# Patient Record
Sex: Male | Born: 1965 | Race: Black or African American | Hispanic: No | Marital: Single | Smoking: Never smoker
Health system: Southern US, Community
[De-identification: ages and names within clinical notes are randomized; demographics above are authoritative.]

## PROBLEM LIST (undated history)

## (undated) DIAGNOSIS — E785 Hyperlipidemia, unspecified: Secondary | ICD-10-CM

## (undated) DIAGNOSIS — I10 Essential (primary) hypertension: Secondary | ICD-10-CM

## (undated) DIAGNOSIS — E119 Type 2 diabetes mellitus without complications: Secondary | ICD-10-CM

## (undated) DIAGNOSIS — A549 Gonococcal infection, unspecified: Secondary | ICD-10-CM

## (undated) HISTORY — PX: NO PAST SURGERIES: SHX2092

---

## 2002-12-25 ENCOUNTER — Emergency Department (HOSPITAL_COMMUNITY): Admission: EM | Admit: 2002-12-25 | Discharge: 2002-12-25 | Payer: Self-pay | Admitting: Emergency Medicine

## 2002-12-25 ENCOUNTER — Encounter: Payer: Self-pay | Admitting: Emergency Medicine

## 2009-10-21 ENCOUNTER — Emergency Department (HOSPITAL_COMMUNITY): Admission: EM | Admit: 2009-10-21 | Discharge: 2009-10-21 | Payer: Self-pay | Admitting: Emergency Medicine

## 2010-10-06 LAB — URINE MICROSCOPIC-ADD ON

## 2010-10-06 LAB — URINALYSIS, ROUTINE W REFLEX MICROSCOPIC
Bilirubin Urine: NEGATIVE
Glucose, UA: NEGATIVE mg/dL
Hgb urine dipstick: NEGATIVE
Ketones, ur: NEGATIVE mg/dL
Nitrite: NEGATIVE
Protein, ur: NEGATIVE mg/dL
Specific Gravity, Urine: 1.019 (ref 1.005–1.030)
Urobilinogen, UA: 1 mg/dL (ref 0.0–1.0)
pH: 6 (ref 5.0–8.0)

## 2010-10-06 LAB — GC/CHLAMYDIA PROBE AMP, GENITAL
Chlamydia, DNA Probe: NEGATIVE
GC Probe Amp, Genital: POSITIVE — AB

## 2012-08-09 ENCOUNTER — Inpatient Hospital Stay (HOSPITAL_COMMUNITY)
Admission: EM | Admit: 2012-08-09 | Discharge: 2012-08-12 | DRG: 638 | Disposition: A | Discharge status: Home / Self Care | Payer: MEDICAID | Attending: Internal Medicine | Admitting: Internal Medicine

## 2012-08-09 ENCOUNTER — Encounter (HOSPITAL_COMMUNITY): Payer: Self-pay | Admitting: *Deleted

## 2012-08-09 DIAGNOSIS — F1210 Cannabis abuse, uncomplicated: Secondary | ICD-10-CM | POA: Diagnosis present

## 2012-08-09 DIAGNOSIS — E876 Hypokalemia: Secondary | ICD-10-CM | POA: Diagnosis not present

## 2012-08-09 DIAGNOSIS — E1100 Type 2 diabetes mellitus with hyperosmolarity without nonketotic hyperglycemic-hyperosmolar coma (NKHHC): Principal | ICD-10-CM | POA: Diagnosis present

## 2012-08-09 DIAGNOSIS — E875 Hyperkalemia: Secondary | ICD-10-CM | POA: Diagnosis present

## 2012-08-09 DIAGNOSIS — N179 Acute kidney failure, unspecified: Secondary | ICD-10-CM | POA: Diagnosis present

## 2012-08-09 DIAGNOSIS — R739 Hyperglycemia, unspecified: Secondary | ICD-10-CM

## 2012-08-09 DIAGNOSIS — E785 Hyperlipidemia, unspecified: Secondary | ICD-10-CM | POA: Diagnosis present

## 2012-08-09 DIAGNOSIS — Z6841 Body Mass Index (BMI) 40.0 and over, adult: Secondary | ICD-10-CM

## 2012-08-09 DIAGNOSIS — E669 Obesity, unspecified: Secondary | ICD-10-CM | POA: Diagnosis present

## 2012-08-09 DIAGNOSIS — I10 Essential (primary) hypertension: Secondary | ICD-10-CM | POA: Diagnosis present

## 2012-08-09 DIAGNOSIS — E871 Hypo-osmolality and hyponatremia: Secondary | ICD-10-CM | POA: Diagnosis present

## 2012-08-09 HISTORY — DX: Hyperlipidemia, unspecified: E78.5

## 2012-08-09 HISTORY — DX: Essential (primary) hypertension: I10

## 2012-08-09 HISTORY — DX: Gonococcal infection, unspecified: A54.9

## 2012-08-09 LAB — URINE MICROSCOPIC-ADD ON

## 2012-08-09 LAB — URINALYSIS, ROUTINE W REFLEX MICROSCOPIC
Bilirubin Urine: NEGATIVE
Glucose, UA: >1000 mg/dL — AB
Hgb urine dipstick: NEGATIVE
Ketones, ur: 15 mg/dL — AB
Leukocytes, UA: NEGATIVE
Nitrite: NEGATIVE
Protein, ur: NEGATIVE mg/dL
Specific Gravity, Urine: 1.033 — ABNORMAL HIGH (ref 1.005–1.030)
Urobilinogen, UA: 0.2 mg/dL (ref 0.0–1.0)
pH: 5 (ref 5.0–8.0)

## 2012-08-09 LAB — POCT I-STAT 3, ART BLOOD GAS (G3+)
Acid-base deficit: 5 mmol/L — ABNORMAL HIGH (ref 0.0–2.0)
Bicarbonate: 19.4 mEq/L — ABNORMAL LOW (ref 20.0–24.0)
O2 Saturation: 93 %
Patient temperature: 98.6
TCO2: 20 mmol/L (ref 0–100)
pCO2 arterial: 33.5 mmHg — ABNORMAL LOW (ref 35.0–45.0)
pH, Arterial: 7.371 (ref 7.350–7.450)
pO2, Arterial: 68 mmHg — ABNORMAL LOW (ref 80.0–100.0)

## 2012-08-09 LAB — COMPREHENSIVE METABOLIC PANEL
ALT: 26 U/L (ref 0–53)
AST: 18 U/L (ref 0–37)
Albumin: 4.4 g/dL (ref 3.5–5.2)
Alkaline Phosphatase: 67 U/L (ref 39–117)
BUN: 24 mg/dL — ABNORMAL HIGH (ref 6–23)
CO2: 19 mEq/L (ref 19–32)
Calcium: 9.9 mg/dL (ref 8.4–10.5)
Chloride: 80 mEq/L — ABNORMAL LOW (ref 96–112)
Creatinine, Ser: 1.44 mg/dL — ABNORMAL HIGH (ref 0.50–1.35)
GFR calc Af Amer: 66 mL/min — ABNORMAL LOW (ref >90)
GFR calc non Af Amer: 57 mL/min — ABNORMAL LOW (ref >90)
Glucose, Bld: 856 mg/dL (ref 70–99)
Potassium: 5.4 mEq/L — ABNORMAL HIGH (ref 3.5–5.1)
Sodium: 121 mEq/L — ABNORMAL LOW (ref 135–145)
Total Bilirubin: 1.1 mg/dL (ref 0.3–1.2)
Total Protein: 8.8 g/dL — ABNORMAL HIGH (ref 6.0–8.3)

## 2012-08-09 LAB — BASIC METABOLIC PANEL
BUN: 18 mg/dL (ref 6–23)
CO2: 26 mEq/L (ref 19–32)
Calcium: 9.1 mg/dL (ref 8.4–10.5)
Chloride: 93 mEq/L — ABNORMAL LOW (ref 96–112)
Creatinine, Ser: 1.28 mg/dL (ref 0.50–1.35)
GFR calc Af Amer: 76 mL/min — ABNORMAL LOW (ref >90)
GFR calc non Af Amer: 65 mL/min — ABNORMAL LOW (ref >90)
Glucose, Bld: 278 mg/dL — ABNORMAL HIGH (ref 70–99)
Potassium: 3.6 mEq/L (ref 3.5–5.1)
Sodium: 132 mEq/L — ABNORMAL LOW (ref 135–145)

## 2012-08-09 LAB — CBC WITH DIFFERENTIAL/PLATELET
Basophils Absolute: 0.1 10*3/uL (ref 0.0–0.1)
Basophils Relative: 1 % (ref 0–1)
Eosinophils Absolute: 0.1 10*3/uL (ref 0.0–0.7)
Eosinophils Relative: 1 % (ref 0–5)
HCT: 45.1 % (ref 39.0–52.0)
Hemoglobin: 16.4 g/dL (ref 13.0–17.0)
Lymphocytes Relative: 41 % (ref 12–46)
Lymphs Abs: 2.6 10*3/uL (ref 0.7–4.0)
MCH: 30.4 pg (ref 26.0–34.0)
MCHC: 36.4 g/dL — ABNORMAL HIGH (ref 30.0–36.0)
MCV: 83.7 fL (ref 78.0–100.0)
Monocytes Absolute: 0.5 10*3/uL (ref 0.1–1.0)
Monocytes Relative: 7 % (ref 3–12)
Neutro Abs: 3.2 10*3/uL (ref 1.7–7.7)
Neutrophils Relative %: 50 % (ref 43–77)
Platelets: 159 10*3/uL (ref 150–400)
RBC: 5.39 MIL/uL (ref 4.22–5.81)
RDW: 12 % (ref 11.5–15.5)
WBC: 6.4 10*3/uL (ref 4.0–10.5)

## 2012-08-09 LAB — GLUCOSE, CAPILLARY
Glucose-Capillary: >600 mg/dL (ref 70–99)
Glucose-Capillary: >600 mg/dL (ref 70–99)
Glucose-Capillary: 579 mg/dL (ref 70–99)
Glucose-Capillary: 482 mg/dL — ABNORMAL HIGH (ref 70–99)
Glucose-Capillary: 438 mg/dL — ABNORMAL HIGH (ref 70–99)
Glucose-Capillary: 392 mg/dL — ABNORMAL HIGH (ref 70–99)

## 2012-08-09 MED ORDER — INSULIN REGULAR BOLUS VIA INFUSION
0.0000 [IU] | Freq: Three times a day (TID) | INTRAVENOUS | Status: DC
  Administered 2012-08-09: 4 [IU] via INTRAVENOUS
  Filled 2012-08-09: qty 10

## 2012-08-09 MED ORDER — DEXTROSE-NACL 5-0.45 % IV SOLN
INTRAVENOUS | Status: DC
  Administered 2012-08-10 (×2): via INTRAVENOUS

## 2012-08-09 MED ORDER — INSULIN REGULAR BOLUS VIA INFUSION
0.0000 [IU] | Freq: Three times a day (TID) | INTRAVENOUS | Status: DC
  Filled 2012-08-09: qty 10

## 2012-08-09 MED ORDER — ONDANSETRON HCL 4 MG/2ML IJ SOLN: 4.0000 mg | Freq: Four times a day (QID) | INTRAMUSCULAR | Status: DC | PRN

## 2012-08-09 MED ORDER — INSULIN REGULAR HUMAN 100 UNIT/ML IJ SOLN
INTRAMUSCULAR | Status: DC
  Administered 2012-08-10: 02:00:00 via INTRAVENOUS
  Filled 2012-08-09 (×3): qty 1

## 2012-08-09 MED ORDER — ONDANSETRON HCL 4 MG PO TABS: 4.0000 mg | ORAL_TABLET | Freq: Four times a day (QID) | ORAL | Status: DC | PRN

## 2012-08-09 MED ORDER — ACETAMINOPHEN 325 MG PO TABS: 650.0000 mg | ORAL_TABLET | Freq: Four times a day (QID) | ORAL | Status: DC | PRN

## 2012-08-09 MED ORDER — SODIUM CHLORIDE 0.9 % IV BOLUS (SEPSIS)
1000.0000 mL | Freq: Once | INTRAVENOUS | Status: AC
  Administered 2012-08-09: 1000 mL via INTRAVENOUS

## 2012-08-09 MED ORDER — MENTHOL 3 MG MT LOZG
1.0000 | LOZENGE | OROMUCOSAL | Status: DC | PRN
  Filled 2012-08-09: qty 9

## 2012-08-09 MED ORDER — DEXTROSE 50 % IV SOLN: 25.0000 mL | INTRAVENOUS | Status: DC | PRN

## 2012-08-09 MED ORDER — ENOXAPARIN SODIUM 40 MG/0.4ML ~~LOC~~ SOLN
40.0000 mg | SUBCUTANEOUS | Status: DC
  Administered 2012-08-09 – 2012-08-11 (×3): 40 mg via SUBCUTANEOUS
  Filled 2012-08-09 (×4): qty 0.4

## 2012-08-09 MED ORDER — HYDRALAZINE HCL 20 MG/ML IJ SOLN
10.0000 mg | Freq: Four times a day (QID) | INTRAMUSCULAR | Status: DC | PRN
  Filled 2012-08-09: qty 0.5

## 2012-08-09 MED ORDER — ACETAMINOPHEN 650 MG RE SUPP: 650.0000 mg | Freq: Four times a day (QID) | RECTAL | Status: DC | PRN

## 2012-08-09 MED ORDER — ALBUTEROL SULFATE (5 MG/ML) 0.5% IN NEBU: 2.5000 mg | INHALATION_SOLUTION | RESPIRATORY_TRACT | Status: DC | PRN

## 2012-08-09 MED ORDER — SODIUM CHLORIDE 0.9 % IJ SOLN: 3.0000 mL | Freq: Two times a day (BID) | INTRAMUSCULAR | Status: DC

## 2012-08-09 MED ORDER — PANTOPRAZOLE SODIUM 40 MG PO TBEC
40.0000 mg | DELAYED_RELEASE_TABLET | Freq: Every day | ORAL | Status: DC
  Administered 2012-08-09 – 2012-08-10 (×2): 40 mg via ORAL
  Filled 2012-08-09 (×2): qty 1

## 2012-08-09 MED ORDER — SODIUM CHLORIDE 0.9 % IV SOLN
INTRAVENOUS | Status: DC
  Administered 2012-08-09 – 2012-08-10 (×3): via INTRAVENOUS

## 2012-08-09 MED ORDER — DEXTROSE-NACL 5-0.45 % IV SOLN
INTRAVENOUS | Status: DC
  Administered 2012-08-09: 19:00:00 via INTRAVENOUS

## 2012-08-09 MED ORDER — SODIUM CHLORIDE 0.9 % IV SOLN
INTRAVENOUS | Status: DC
  Administered 2012-08-09: 8 [IU]/h via INTRAVENOUS
  Filled 2012-08-09: qty 1

## 2012-08-09 MED ORDER — ALUM & MAG HYDROXIDE-SIMETH 200-200-20 MG/5ML PO SUSP: 30.0000 mL | Freq: Four times a day (QID) | ORAL | Status: DC | PRN

## 2012-08-09 MED ORDER — SODIUM CHLORIDE 0.9 % IV SOLN
INTRAVENOUS | Status: DC
  Administered 2012-08-09 (×2): via INTRAVENOUS

## 2012-08-09 MED ORDER — GI COCKTAIL ~~LOC~~
30.0000 mL | Freq: Once | ORAL | Status: AC
  Administered 2012-08-09: 30 mL via ORAL
  Filled 2012-08-09: qty 30

## 2012-08-09 NOTE — ED Provider Notes (Signed)
History     CSN: [id number]  Arrival date & time 08/09/12  1136   First MD Initiated Contact with Patient 08/09/12 1336      Chief Complaint  Patient presents with  . Abdominal Pain  . Urinary Frequency    (Consider location/radiation/quality/duration/timing/severity/associated sxs/prior treatment) HPI Comments: Patient with a history of hypertension presents with heartburn and penis pain x 1 week. He states he is having heartburn after he eats any food. He has tried taking Nexium for his symptoms but this has not helped. He is nauseated and has no appetite, but has not vomited. He also complains of penis pain. He is not circumcised and states there is a "coating over the head of my penis." He describes the coating as thick and brown. He has tried putting Vagisil on the head of his penis which seems to help with his symptoms. This is improving. He denies dysuria but reports urinary urgency and frequency. He also states his mouth is constantly dry no matter how much he drinks. He endorses polydipsia and polyuria. Mother and sister have history of DM. Onset gradual, course constant. Nothing has made symptoms better.   Patient is a 47 y.o. male presenting with abdominal pain and frequency. The history is provided by the patient.  Abdominal Pain The primary symptoms of the illness include abdominal pain (heartburn) and nausea. The primary symptoms of the illness do not include fever, fatigue, shortness of breath, vomiting, diarrhea or dysuria.  Additional symptoms associated with the illness include urgency and frequency. Symptoms associated with the illness do not include chills, constipation or hematuria.  Urinary Frequency Associated symptoms include abdominal pain (heartburn), nausea and a sore throat. Pertinent negatives include no chest pain, chills, congestion, coughing, fatigue, fever, headaches, myalgias, neck pain, rash, vomiting or weakness.    Past Medical History  Diagnosis Date    . Hypertension     History reviewed. No pertinent past surgical history.  History reviewed. No pertinent family history.  History  Substance Use Topics  . Smoking status: Never Smoker   . Smokeless tobacco: Not on file  . Alcohol Use: No      Review of Systems  Constitutional: Positive for appetite change. Negative for fever, chills and fatigue.  HENT: Positive for sore throat. Negative for ear pain, congestion, rhinorrhea, trouble swallowing and neck pain.   Eyes: Negative for redness.  Respiratory: Negative for cough, chest tightness and shortness of breath.   Cardiovascular: Negative for chest pain.  Gastrointestinal: Positive for nausea and abdominal pain (heartburn). Negative for vomiting, diarrhea, constipation and blood in stool.  Genitourinary: Positive for urgency, frequency and penile pain. Negative for dysuria, hematuria, discharge and genital sores.  Musculoskeletal: Negative for myalgias.  Skin: Negative for rash.  Neurological: Negative for weakness, light-headedness and headaches.    Allergies  Review of patient's allergies indicates no known allergies.  Home Medications   Current Outpatient Rx  Name  Route  Sig  Dispense  Refill  . HYDROCHLOROTHIAZIDE 25 MG PO TABS   Oral   Take 25 mg by mouth daily.         Ronney Asters COLD/FLU SEVERE PO   Oral   Take 2 capsules by mouth once.           BP 158/96  Pulse 117  Temp 98.8 F (37.1 C) (Oral)  SpO2 96%  Physical Exam  Nursing note and vitals reviewed. Constitutional: He is oriented to person, place, and time. He appears well-developed and well-nourished.  HENT:  Head: Normocephalic and atraumatic.  Eyes: Conjunctivae normal are normal. Pupils are equal, round, and reactive to light. Right eye exhibits no discharge. Left eye exhibits no discharge.  Neck: Normal range of motion. Neck supple.  Cardiovascular: Regular rhythm and normal heart sounds.  Tachycardia present.   Pulmonary/Chest: Effort  normal and breath sounds normal.  Abdominal: Soft. Bowel sounds are normal. He exhibits no mass. There is no tenderness.  Genitourinary: Right testis shows no mass and no tenderness. Left testis shows no mass and no tenderness. Uncircumcised. Penile erythema (head of penis) present. No phimosis, paraphimosis or penile tenderness. No discharge found.  Musculoskeletal: He exhibits no edema and no tenderness.  Lymphadenopathy:    He has no cervical adenopathy.  Neurological: He is alert and oriented to person, place, and time.  Skin: Skin is warm and dry.       Acanthosis nigricans bilateral axilla and around neck.   Psychiatric: He has a normal mood and affect.    ED Course  Procedures (including critical care time)  Labs Reviewed  URINALYSIS, ROUTINE W REFLEX MICROSCOPIC - Abnormal; Notable for the following:    Specific Gravity, Urine 1.033 (*)     Glucose, UA >1000 (*)     Ketones, ur 15 (*)     All other components within normal limits  CBC WITH DIFFERENTIAL - Abnormal; Notable for the following:    MCHC 36.4 (*)     All other components within normal limits  COMPREHENSIVE METABOLIC PANEL - Abnormal; Notable for the following:    Sodium 121 (*)     Potassium 5.4 (*)     Chloride 80 (*)     Glucose, Bld 856 (*)     BUN 24 (*)     Creatinine, Ser 1.44 (*)     Total Protein 8.8 (*)     GFR calc non Af Amer 57 (*)     GFR calc Af Amer 66 (*)     All other components within normal limits  GLUCOSE, CAPILLARY - Abnormal; Notable for the following:    Glucose-Capillary >600 (*)     All other components within normal limits  GLUCOSE, CAPILLARY - Abnormal; Notable for the following:    Glucose-Capillary >600 (*)     All other components within normal limits  URINE MICROSCOPIC-ADD ON  HEMOGLOBIN A1C   No results found.   1. Hyperglycemia     Patient seen and examined. Work-up initiated. Medications ordered, 3L fluids. Patient d/w and seen with Dr. Ignacia Palma. New diagnosis DM.     Vital signs reviewed and are as follows: Filed Vitals:   08/09/12 1514  BP: 140/85  Pulse: 102  Temp: 97.6 F (36.4 C)  Resp: 18   Will admit patient to stepdown. [facility] to see.   MDM  Admit hyperglycemia, new dx diabetes.     [place], [place] 08/09/12 [phone]

## 2012-08-09 NOTE — ED Notes (Signed)
CBG 392 

## 2012-08-09 NOTE — ED Notes (Addendum)
To ED for eval of weakness, decreased appetite, 'coating over the head of my penis' since last week. Pt denies flu like symptoms. Speech clear, resp e/u. Also c/o frequent urination at night and 'heartburn after I eat'. No CP 'ever only burning after I eat'.

## 2012-08-09 NOTE — ED Provider Notes (Signed)
Medical screening examination/treatment/procedure(s) were conducted as a shared visit with non-physician practitioner(s) and myself.  I personally evaluated the patient during the encounter 47 yo man with new symptoms of polyuria, found to have blood sugar >600, lab diluting his serum to get exact reading. Will need admission, glucose stabilizer,.        Carleene Cooper III, MD 08/09/12 2014

## 2012-08-09 NOTE — H&P (Signed)
[facility] History and Physical  Prithvi Kooi WGN:[id number] DOB: [birth year] DOA: 08/09/2012  Referring physician: Renne Crigler, ED PA PCP: Default, Provider, MD @ [facility] Specialists: None.  Chief Complaint: Urinary frequency.  HPI: Joshua Petty is a 47 y.o. male with history of HTN, HL presented to ED on 1/23 with 3 weeks history of progressively worsening polyuria (wakes up to 20 times at night), polydipsia, thirst, generalized weakness and blurred vision. He complains of burning of throat whenever he tries to eat or drink, associated nausea but no vomiting or abdominal pain, relieved to an extent by drinking cold liquids. Due to this, patient has only been drinking juices and eating fruits. No diarrhea. He gives history of head of penis being coated and irritative-apparently improved/resolved after using Vagisil. Recently had unprotected sex. HIV tested x2 (last one in December 2013) said to be negative. In ED, lab significant for sodium 121, potassium 5.4, creatinine 1.44, blood glucose 856. He is being bolused with 3 L of normal saline. The hospitalist service is requested to admit for further evaluation and management.   Review of Systems: All systems reviewed and apart from history of presenting illness, are negative  Past Medical History  Diagnosis Date  . Hypertension   . Hyperlipidemia   . Gonorrhea    Past Surgical History  Procedure Date  . No past surgeries    Social History:  reports that he has never smoked. He does not have any smokeless tobacco history on file. He reports that he uses illicit drugs (Marijuana). He reports that he does not drink alcohol. Single. Independent of activities of daily living.  No Known Allergies  Family History  Problem Relation Age of Onset  . Diabetes Father   . Diabetes Sister     Prior to Admission medications   Medication Sig Start Date End Date Taking? Authorizing Provider  hydrochlorothiazide (HYDRODIURIL)  25 MG tablet Take 25 mg by mouth daily.   Yes Historical Provider, MD  Phenylephrine-DM-GG-APAP (TYLENOL COLD/FLU SEVERE PO) Take 2 capsules by mouth once.   Yes Historical Provider, MD   Physical Exam: Filed Vitals:   08/09/12 1200 08/09/12 1514 08/09/12 1655  BP: 158/96 140/85 138/90  Pulse: 117 102   Temp: 98.8 F (37.1 C) 97.6 F (36.4 C)   TempSrc: Oral Oral   Resp:  18 18  SpO2: 96%       General exam: Moderately built and obese male patient lying comfortably supine in bed.  Head, eyes and ENT: Nontraumatic and normocephalic. Pupils equally reacting to light and accommodation. Bilateral immature cataracts. Oral mucosa dry. Bilateral enlarged but noninflamed tonsils. Unable to visualize posterior pharyngeal wall clearly.  Neck: Supple. No JVD, carotid bruit or thyromegaly.  Lymphatics: No lymphadenopathy.  Respiratory system: Clear to auscultation. No increased work of breathing.  Cardiovascular system: First and second heart sounds heard, RRR. No JVD, murmurs or pedal edema.  Gastrointestinal system: Abdomen is nondistended, soft and nontender. No organomegaly or masses appreciated. Normal bowel sounds heard.  External genitalia: No obvious acute abnormality seen.  Central nervous system: Alert and oriented. No focal neurological deficits.  Extremities: Symmetric 5 x 5 power. Peripheral pulses symmetrically felt.  Skin: Hyperpigmentation of skin around anterior neck (possibly related to DM)  Psychiatry: Pleasant and cooperative  Labs on Admission:  Basic Metabolic Panel:  Lab 08/09/12 7846  NA 121*  K 5.4*  CL 80*  CO2 19  GLUCOSE 856*  BUN 24*  CREATININE 1.44*  CALCIUM 9.9  MG --  PHOS --   Liver Function Tests:  Lab 08/09/12 1432  AST 18  ALT 26  ALKPHOS 67  BILITOT 1.1  PROT 8.8*  ALBUMIN 4.4   No results found for this basename: LIPASE:5,AMYLASE:5 in the last 168 hours No results found for this basename: AMMONIA:5 in the last 168  hours CBC:  Lab 08/09/12 1432  WBC 6.4  NEUTROABS 3.2  HGB 16.4  HCT 45.1  MCV 83.7  PLT 159   Cardiac Enzymes: No results found for this basename: CKTOTAL:5,CKMB:5,CKMBINDEX:5,TROPONINI:5 in the last 168 hours  BNP (last 3 results) No results found for this basename: PROBNP:3 in the last 8760 hours CBG:  Lab 08/09/12 1708 08/09/12 1614 08/09/12 1436  GLUCAP 579* >600* >600*    Radiological Exams on Admission: No results found.    Assessment/Plan Principal Problem:  *DM hyperosmolarity type II, uncontrolled Active Problems:  Dehydration with hyponatremia  Acute renal failure  Hyperkalemia  Marijuana abuse  Obesity  Newly diagnosed type 2 diabetes mellitus with hyperosmolar state Although anion gap: 22, small ketones in urine, patient's bicarbonate is almost normal. Admit to step down for close monitoring and management. Aggressive IV fluid hydration. Place on IV insulin drip. Monitor BMP closely. Check hemoglobin A1c. Diabetic diet. Consult diabetes coordinator. Once his blood sugar control has improved, transition to Lantus and sliding scale insulin. At discharge, he may have to go on 70/30 insulin for affordability  Dehydration Aggressive IV fluid hydration.  Hyponatremia Secondary to dehydration and hypoglycemia. Management as above and monitor BMP closely. Hold HCTZ.  Acute renal failure Baseline creatinine not available. Likely secondary to dehydration. Hydrate and follow BMP closely. Nonoliguric.  Hyperkalemia, mild Hydrate. Insulin drip. Followup BMP.  Hypertension Hold HCTZ. When necessary IV hydralazine. Consider ACE inhibitor when acute events have resolved.  Marijuana abuse Cessation counseled.  History of STD Patient's complaint of penile irritation was probably secondary to fungal infection that seems to have resolved. Given history of unprotected sex, we'll repeat HIV antibody. Counseled regarding protected sex.    Code Status: Full Family  Communication: Discussed with patient  Disposition Plan: Home when medically stable. Will need at least 2 midnights stay.  Time spent: 70 minutes  [facility] Pager [phone]  If 7PM-7AM, please contact night-coverage [email or web address] Password [facility] 08/09/2012, 5:15 PM

## 2012-08-09 NOTE — ED Notes (Signed)
CBG: 482 

## 2012-08-09 NOTE — Progress Notes (Signed)
47 yo man with new symptoms of polyuria, found to have blood sugar >600, lab diluting his serum to get exact reading.  Will need admission, glucose stabilizer,.

## 2012-08-10 DIAGNOSIS — E669 Obesity, unspecified: Secondary | ICD-10-CM

## 2012-08-10 LAB — CBC
HCT: 35.5 % — ABNORMAL LOW (ref 39.0–52.0)
Hemoglobin: 13.1 g/dL (ref 13.0–17.0)
MCH: 30.5 pg (ref 26.0–34.0)
MCHC: 36.9 g/dL — ABNORMAL HIGH (ref 30.0–36.0)
MCV: 82.8 fL (ref 78.0–100.0)
Platelets: 126 10*3/uL — ABNORMAL LOW (ref 150–400)
RBC: 4.29 MIL/uL (ref 4.22–5.81)
RDW: 12 % (ref 11.5–15.5)
WBC: 6.3 10*3/uL (ref 4.0–10.5)

## 2012-08-10 LAB — GLUCOSE, CAPILLARY
Glucose-Capillary: 180 mg/dL — ABNORMAL HIGH (ref 70–99)
Glucose-Capillary: 195 mg/dL — ABNORMAL HIGH (ref 70–99)
Glucose-Capillary: 210 mg/dL — ABNORMAL HIGH (ref 70–99)
Glucose-Capillary: 265 mg/dL — ABNORMAL HIGH (ref 70–99)
Glucose-Capillary: 284 mg/dL — ABNORMAL HIGH (ref 70–99)
Glucose-Capillary: 287 mg/dL — ABNORMAL HIGH (ref 70–99)
Glucose-Capillary: 293 mg/dL — ABNORMAL HIGH (ref 70–99)
Glucose-Capillary: 324 mg/dL — ABNORMAL HIGH (ref 70–99)
Glucose-Capillary: 327 mg/dL — ABNORMAL HIGH (ref 70–99)
Glucose-Capillary: 191 mg/dL — ABNORMAL HIGH (ref 70–99)
Glucose-Capillary: 183 mg/dL — ABNORMAL HIGH (ref 70–99)
Glucose-Capillary: 122 mg/dL — ABNORMAL HIGH (ref 70–99)
Glucose-Capillary: 141 mg/dL — ABNORMAL HIGH (ref 70–99)
Glucose-Capillary: 170 mg/dL — ABNORMAL HIGH (ref 70–99)
Glucose-Capillary: 177 mg/dL — ABNORMAL HIGH (ref 70–99)
Glucose-Capillary: 367 mg/dL — ABNORMAL HIGH (ref 70–99)
Glucose-Capillary: 373 mg/dL — ABNORMAL HIGH (ref 70–99)

## 2012-08-10 LAB — BASIC METABOLIC PANEL
BUN: 17 mg/dL (ref 6–23)
CO2: 25 mEq/L (ref 19–32)
Calcium: 8.8 mg/dL (ref 8.4–10.5)
Chloride: 97 mEq/L (ref 96–112)
Creatinine, Ser: 1.21 mg/dL (ref 0.50–1.35)
GFR calc Af Amer: 81 mL/min — ABNORMAL LOW (ref >90)
GFR calc non Af Amer: 70 mL/min — ABNORMAL LOW (ref >90)
Glucose, Bld: 238 mg/dL — ABNORMAL HIGH (ref 70–99)
Potassium: 2.9 mEq/L — ABNORMAL LOW (ref 3.5–5.1)
Sodium: 133 mEq/L — ABNORMAL LOW (ref 135–145)

## 2012-08-10 LAB — HIV ANTIBODY (ROUTINE TESTING W REFLEX): HIV: NONREACTIVE

## 2012-08-10 LAB — MRSA PCR SCREENING: MRSA by PCR: NEGATIVE

## 2012-08-10 LAB — HEMOGLOBIN A1C
Hgb A1c MFr Bld: 14.2 % — ABNORMAL HIGH (ref <5.7)
Mean Plasma Glucose: 361 mg/dL — ABNORMAL HIGH (ref <117)

## 2012-08-10 MED ORDER — SODIUM CHLORIDE 0.9 % IV SOLN
INTRAVENOUS | Status: AC
  Administered 2012-08-10: 14.8 [IU]/h via INTRAVENOUS
  Filled 2012-08-10: qty 1

## 2012-08-10 MED ORDER — METFORMIN HCL 500 MG PO TABS
500.0000 mg | ORAL_TABLET | Freq: Two times a day (BID) | ORAL | Status: DC
  Administered 2012-08-10 – 2012-08-11 (×2): 500 mg via ORAL
  Filled 2012-08-10 (×5): qty 1

## 2012-08-10 MED ORDER — HYDROCHLOROTHIAZIDE 25 MG PO TABS
25.0000 mg | ORAL_TABLET | Freq: Every day | ORAL | Status: DC
  Administered 2012-08-10 – 2012-08-12 (×3): 25 mg via ORAL
  Filled 2012-08-10 (×3): qty 1

## 2012-08-10 MED ORDER — INSULIN DETEMIR 100 UNIT/ML ~~LOC~~ SOLN
30.0000 [IU] | Freq: Every day | SUBCUTANEOUS | Status: DC
  Administered 2012-08-10: 30 [IU] via SUBCUTANEOUS
  Filled 2012-08-10: qty 10

## 2012-08-10 MED ORDER — POTASSIUM CHLORIDE CRYS ER 20 MEQ PO TBCR
40.0000 meq | EXTENDED_RELEASE_TABLET | ORAL | Status: AC
  Administered 2012-08-10 – 2012-08-11 (×3): 40 meq via ORAL
  Filled 2012-08-10 (×3): qty 2

## 2012-08-10 MED ORDER — GLIPIZIDE 10 MG PO TABS
10.0000 mg | ORAL_TABLET | Freq: Every day | ORAL | Status: DC
  Administered 2012-08-11: 10 mg via ORAL
  Filled 2012-08-10 (×3): qty 1

## 2012-08-10 MED ORDER — ASPIRIN 81 MG PO CHEW
CHEWABLE_TABLET | ORAL | Status: AC
  Administered 2012-08-10: 81 mg
  Filled 2012-08-10: qty 1

## 2012-08-10 MED ORDER — INSULIN ASPART 100 UNIT/ML ~~LOC~~ SOLN
4.0000 [IU] | Freq: Three times a day (TID) | SUBCUTANEOUS | Status: DC
  Administered 2012-08-10: 4 [IU] via SUBCUTANEOUS

## 2012-08-10 MED ORDER — INSULIN DETEMIR 100 UNIT/ML ~~LOC~~ SOLN
20.0000 [IU] | Freq: Every day | SUBCUTANEOUS | Status: DC
  Filled 2012-08-10: qty 10

## 2012-08-10 MED ORDER — INSULIN GLARGINE 100 UNIT/ML ~~LOC~~ SOLN: 20.0000 [IU] | Freq: Every day | SUBCUTANEOUS | Status: DC

## 2012-08-10 MED ORDER — INSULIN GLARGINE 100 UNIT/ML ~~LOC~~ SOLN
30.0000 [IU] | Freq: Every day | SUBCUTANEOUS | Status: DC
  Administered 2012-08-10: 30 [IU] via SUBCUTANEOUS

## 2012-08-10 MED ORDER — INSULIN DETEMIR 100 UNIT/ML ~~LOC~~ SOLN: 30.0000 [IU] | Freq: Every day | SUBCUTANEOUS | Status: DC

## 2012-08-10 MED ORDER — INSULIN ASPART 100 UNIT/ML ~~LOC~~ SOLN
0.0000 [IU] | Freq: Three times a day (TID) | SUBCUTANEOUS | Status: DC
  Administered 2012-08-10: 15 [IU] via SUBCUTANEOUS

## 2012-08-10 MED ORDER — ASPIRIN EC 81 MG PO TBEC
81.0000 mg | DELAYED_RELEASE_TABLET | Freq: Every day | ORAL | Status: DC
  Administered 2012-08-10 – 2012-08-12 (×3): 81 mg via ORAL
  Filled 2012-08-10 (×3): qty 1

## 2012-08-10 MED ORDER — LIVING WELL WITH DIABETES BOOK
Freq: Once | Status: AC
  Administered 2012-08-10: 11:00:00
  Filled 2012-08-10: qty 1

## 2012-08-10 MED ORDER — INSULIN ASPART 100 UNIT/ML ~~LOC~~ SOLN
0.0000 [IU] | Freq: Every day | SUBCUTANEOUS | Status: DC
  Administered 2012-08-10: 5 [IU] via SUBCUTANEOUS
  Administered 2012-08-11: 2 [IU] via SUBCUTANEOUS

## 2012-08-10 NOTE — Progress Notes (Signed)
[facility] Progress Note [place] TEAM 1 - Stepdown/ICU TEAM   Meredeth Ide GMW:[id number] DOB: August 30, 1965 DOA: 08/09/2012 PCP: Default, Provider, MD  Brief narrative: 47 y.o. male with history of HTN, HLD presented to ED on 1/23 with 3 weeks history of progressively worsening polyuria (wakes up to 20 times at night), polydipsia, generalized weakness and blurred vision. He complained of burning of throat whenever he tried to eat or drink, associated nausea but no vomiting or abdominal pain, relieved to an extent by drinking cold liquids. Due to this, patient had only been drinking juices and eating fruits. No diarrhea. He gave history of head of penis being coated and irritated - apparently improved/resolved after using Vagisil. Recently had unprotected sex. HIV tested x2 (last one in December 2013) said to be negative. In ED, lab significant for sodium 121, potassium 5.4, creatinine 1.44, blood glucose 856. He was bolused with 3 L of normal saline.    Assessment/Plan:  Severe hyperglycemia in newly diagnosed DM A1c 14.2 - initiate oral agents as well as SSI and follow trend - given extreme hyperglycemia at admit, will need to make sure CBG extremes are controlled prior to d/c home - initiate low dose daily asa - consider adding ACE before d/c, but not yet due to renal insuff - pneumovax and flu shot prior to d/c if not already given - DM education ongoing   Pseudohyponatremia Resolving w/ CBG control  Dehydration Due to hyperglycemia induced polyuria - much improved  Acute renal failure crt 1.44 at time of admit - now normalized w/ volume   Hyperkalemia >> Hypokalemia Due to above - replace and follow trend - check Mg  Obesity Educated on need to loose weight and link to DM  HTN Well controlled at present - follow as cont to hydrate  HLD Check fasting lipid panel in AM - LFTs are normal  Marijuana abuse Advised on need to abstain   Code Status: FULL Disposition  Plan: transfer to medical bed  Consultants: none  Procedures: none  Antibiotics: none  DVT prophylaxis: lovenox  HPI/Subjective: The patient is alert and oriented.  He feels much better today.  He denies chest pain fevers chills nausea vomiting or abdominal pain.   Objective: Blood pressure 130/87, pulse 96, temperature 98.1 F (36.7 C), temperature source Oral, resp. rate 23, height 5\' 7"  (1.702 m), weight 126.5 kg (278 lb 14.1 oz), SpO2 96.00%.  Intake/Output Summary (Last 24 hours) at 08/10/12 1509 Last data filed at 08/10/12 4403  Gross per 24 hour  Intake 3026.67 ml  Output   1300 ml  Net 1726.67 ml     Exam: General: No acute respiratory distress Lungs: Clear to auscultation bilaterally without wheezes or crackles Cardiovascular: Regular rate and rhythm without murmur gallop or rub normal S1 and S2 Abdomen: Nontender, nondistended, soft, bowel sounds positive, no rebound, no ascites, no appreciable mass - overweight Extremities: No significant cyanosis, clubbing, or edema bilateral lower extremities  Data Reviewed: Basic Metabolic Panel:  Lab 08/10/12 4742 08/09/12 2140 08/09/12 1432  NA 133* 132* 121*  K 2.9* 3.6 5.4*  CL 97 93* 80*  CO2 25 26 19   GLUCOSE 238* 278* 856*  BUN 17 18 24*  CREATININE 1.21 1.28 1.44*  CALCIUM 8.8 9.1 9.9  MG -- -- --  PHOS -- -- --   Liver Function Tests:  Lab 08/09/12 1432  AST 18  ALT 26  ALKPHOS 67  BILITOT 1.1  PROT 8.8*  ALBUMIN 4.4  CBC:  Lab 08/10/12 0247 08/09/12 1432  WBC 6.3 6.4  NEUTROABS -- 3.2  HGB 13.1 16.4  HCT 35.5* 45.1  MCV 82.8 83.7  PLT 126* 159   CBG:  Lab 08/10/12 1211 08/10/12 1133 08/10/12 1020 08/10/12 0929 08/10/12 0814  GLUCAP 122* 141* 170* 177* 183*    Recent Results (from the past 240 hour(s))  MRSA PCR SCREENING     Status: Normal   Collection Time   08/09/12  9:45 PM      Component Value Range Status Comment   MRSA by PCR NEGATIVE  NEGATIVE Final       Studies:  Recent x-ray studies have been reviewed in detail by the Attending Physician  Scheduled Meds:  Reviewed in detail by the Attending Physician   Lonia Blood, MD [facility] Office  [phone] Pager [phone]  On-Call/Text Page:      [email or web address]      password [email or web address]  If 7PM-7AM, please contact night-coverage [email or web address] Password [email or web address] 08/10/2012, 3:09 PM   LOS: 1 day

## 2012-08-10 NOTE — Progress Notes (Signed)
Wyn Forster, NP notified as per request of Dr. Waymon Amato of EKG and BMP results. Will continue to monitor.

## 2012-08-10 NOTE — Progress Notes (Signed)
Report called to RN Shanita on 5500, pt txed in w/c, pt notified family of new room assignment, meal consumed prior to tx, VSS

## 2012-08-10 NOTE — Plan of Care (Signed)
Problem: Food- and Nutrition-Related Knowledge Deficit (NB-1.1) Goal: Nutrition education Formal process to instruct or train a patient/client in a skill or to impart knowledge to help patients/clients voluntarily manage or modify food choices and eating behavior to maintain or improve health.  Outcome: Completed/Met Date Met:  08/10/12  RD consulted for nutrition education regarding diabetes.     Lab Results  Component Value Date    HGBA1C 14.2* 08/09/2012    RD provided "Carbohydrate Counting for People with Diabetes" handout from the Academy of Nutrition and Dietetics. Discussed different food groups and their effects on blood sugar, emphasizing carbohydrate-containing foods. Provided list of carbohydrates and recommended serving sizes of common foods.  Encouraged patient to eat 3 meals daily, which is something he does not currently do.  Patient very open to making changes in his diet as needed to help control blood sugar.  Discussed importance of controlled and consistent carbohydrate intake throughout the day. Provided examples of ways to balance meals/snacks and encouraged intake of high-fiber, whole grain complex carbohydrates. Teach back method used.  Expect good compliance.  Body mass index is 43.68 kg/(m^2). Pt meets criteria for morbid obesity based on current BMI.  Current diet order is CHO-modified, patient is consuming approximately 100% of meals at this time. Labs and medications reviewed. No further nutrition interventions warranted at this time. RD contact information provided. If additional nutrition issues arise, please re-consult RD.  Joaquin Courts, RD, LDN, CNSC Pager# [phone] After Hours Pager# [phone]

## 2012-08-10 NOTE — Progress Notes (Signed)
Utilization review completed.  

## 2012-08-10 NOTE — Progress Notes (Signed)
Inpatient Diabetes Program Recommendations  AACE/ADA: New Consensus Statement on Inpatient Glycemic Control (2013)  Target Ranges:  Prepandial:   less than 140 mg/dL      Peak postprandial:   less than 180 mg/dL (1-2 hours)      Critically ill patients:  140 - 180 mg/dL   Reason for Visit: new onset DM.    Inpatient Diabetes Program Recommendations HgbA1C: =14.2 (mean plasma glucose = 361)  Diabetes Coordinator spoke with patient concerning new diagnosis of DM.  Patient said that it runs in his family.   Basic skills for controlling diabetes discussed and 'Living Well with Diabetes' pt education workbook provided.  Patient will view the diabetes videos on the patient ed network.  Pt will need to get a glucose meter at discharge. Informed him about the ReliOn meter at [facility] that can be purchased for approx $16 and a vial of 50 strips $9.  Also informed him about the ReliOn insulin available at [facility] for approx $25 a vial.  The patient said he will follow up after discharge at the [facility].   The patient did not have any further questions/concerns at this time.   Thank you  Piedad Climes BSN, RN,CDE Inpatient Diabetes Coordinator [phone] (team pager)

## 2012-08-11 DIAGNOSIS — F1210 Cannabis abuse, uncomplicated: Secondary | ICD-10-CM

## 2012-08-11 LAB — BASIC METABOLIC PANEL
BUN: 12 mg/dL (ref 6–23)
CO2: 22 mEq/L (ref 19–32)
Calcium: 9.1 mg/dL (ref 8.4–10.5)
Chloride: 94 mEq/L — ABNORMAL LOW (ref 96–112)
Creatinine, Ser: 0.98 mg/dL (ref 0.50–1.35)
GFR calc Af Amer: >90 mL/min (ref >90)
GFR calc non Af Amer: >90 mL/min (ref >90)
Glucose, Bld: 351 mg/dL — ABNORMAL HIGH (ref 70–99)
Potassium: 4.2 mEq/L (ref 3.5–5.1)
Sodium: 130 mEq/L — ABNORMAL LOW (ref 135–145)

## 2012-08-11 LAB — GLUCOSE, CAPILLARY
Glucose-Capillary: 345 mg/dL — ABNORMAL HIGH (ref 70–99)
Glucose-Capillary: 383 mg/dL — ABNORMAL HIGH (ref 70–99)
Glucose-Capillary: 274 mg/dL — ABNORMAL HIGH (ref 70–99)
Glucose-Capillary: 209 mg/dL — ABNORMAL HIGH (ref 70–99)

## 2012-08-11 LAB — LIPID PANEL
Cholesterol: 247 mg/dL — ABNORMAL HIGH (ref 0–200)
HDL: 27 mg/dL — ABNORMAL LOW (ref >39)
LDL Cholesterol: UNDETERMINED mg/dL (ref 0–99)
Total CHOL/HDL Ratio: 9.1 RATIO
Triglycerides: 1025 mg/dL — ABNORMAL HIGH (ref <150)
VLDL: UNDETERMINED mg/dL (ref 0–40)

## 2012-08-11 LAB — MAGNESIUM: Magnesium: 1.5 mg/dL (ref 1.5–2.5)

## 2012-08-11 MED ORDER — INSULIN ASPART 100 UNIT/ML ~~LOC~~ SOLN
4.0000 [IU] | Freq: Three times a day (TID) | SUBCUTANEOUS | Status: DC
  Administered 2012-08-11 (×2): 4 [IU] via SUBCUTANEOUS

## 2012-08-11 MED ORDER — INSULIN ASPART PROT & ASPART (70-30 MIX) 100 UNIT/ML ~~LOC~~ SUSP
40.0000 [IU] | Freq: Two times a day (BID) | SUBCUTANEOUS | Status: DC
  Administered 2012-08-11 – 2012-08-12 (×2): 40 [IU] via SUBCUTANEOUS
  Filled 2012-08-11: qty 10

## 2012-08-11 MED ORDER — INSULIN ASPART 100 UNIT/ML ~~LOC~~ SOLN
0.0000 [IU] | Freq: Three times a day (TID) | SUBCUTANEOUS | Status: DC
  Administered 2012-08-11: 15 [IU] via SUBCUTANEOUS
  Administered 2012-08-11: 8 [IU] via SUBCUTANEOUS
  Administered 2012-08-11: 11 [IU] via SUBCUTANEOUS
  Administered 2012-08-12: 8 [IU] via SUBCUTANEOUS
  Administered 2012-08-12: 5 [IU] via SUBCUTANEOUS

## 2012-08-11 MED ORDER — METFORMIN HCL 500 MG PO TABS
1000.0000 mg | ORAL_TABLET | Freq: Two times a day (BID) | ORAL | Status: DC
  Administered 2012-08-11 – 2012-08-12 (×2): 1000 mg via ORAL
  Filled 2012-08-11 (×4): qty 2

## 2012-08-11 NOTE — Progress Notes (Addendum)
[facility] Progress Note   Ander Wamser AVW:[id number] DOB: 1966-01-04 DOA: 08/09/2012 PCP: Default, Provider, MD  Brief narrative: 47 y.o. male with history of HTN, HLD presented to ED on 1/23 with 3 weeks history of progressively worsening polyuria (wakes up to 20 times at night), polydipsia, generalized weakness and blurred vision. He complained of burning of throat whenever he tried to eat or drink, associated nausea but no vomiting or abdominal pain, relieved to an extent by drinking cold liquids. Due to this, patient had only been drinking juices and eating fruits. No diarrhea. He gave history of head of penis being coated and irritated - apparently improved/resolved after using Vagisil. Recently had unprotected sex. HIV tested x2 (last one in December 2013) said to be negative. In ED, lab significant for sodium 121, potassium 5.4, creatinine 1.44, blood glucose 856. He was bolused with 3 L of normal saline.    Assessment/Plan:  Hyperosmolar hyperglycemic state -Presented with a blood glucose of 856, aggressive hydration with IV fluids and insulin drip started. -Blood sugar is poorly controlled for now, needs to increase his insulin. -A1c 14.2 - initiate oral agents as well as SSI and follow trend  -He needs insulin upon discharge. Secondary to financial reasons 70/30 mix is the most appropriate for him.  Pseudohyponatremia Resolving w/ CBG control  Dehydration Due to hyperglycemia induced polyuria - much improved  Acute renal failure Crt 1.44 at time of admit - now normalized w/ volume   Hyperkalemia >> Hypokalemia Due to above - replace and follow trend - check Mg  Obesity Educated on need to loose weight and link to DM  HTN Well controlled at present - follow as cont to hydrate  HLD -Dyslipidemia with high LDL on TGL secondary to uncontrolled diabetes.  Marijuana abuse Advised on need to abstain   Code Status: FULL Disposition Plan: Remain as  inpatient  Consultants: none  Procedures: none  Antibiotics: none  DVT prophylaxis: lovenox  HPI/Subjective: The patient is alert and oriented.  He feels much better today.  He denies chest pain fevers chills nausea vomiting or abdominal pain.   Objective: Blood pressure 125/92, pulse 98, temperature 98.4 F (36.9 C), temperature source Oral, resp. rate 20, height 5\' 7"  (1.702 m), weight 126.5 kg (278 lb 14.1 oz), SpO2 95.00%.  Intake/Output Summary (Last 24 hours) at 08/11/12 1502 Last data filed at 08/10/12 1600  Gross per 24 hour  Intake      0 ml  Output    600 ml  Net   -600 ml     Exam: General: No acute respiratory distress Lungs: Clear to auscultation bilaterally without wheezes or crackles Cardiovascular: Regular rate and rhythm without murmur gallop or rub normal S1 and S2 Abdomen: Nontender, nondistended, soft, bowel sounds positive, no rebound, no ascites, no appreciable mass - overweight Extremities: No significant cyanosis, clubbing, or edema bilateral lower extremities  Data Reviewed: Basic Metabolic Panel:  Lab 08/11/12 8295 08/10/12 0247 08/09/12 2140 08/09/12 1432  NA 130* 133* 132* 121*  K 4.2 2.9* 3.6 5.4*  CL 94* 97 93* 80*  CO2 22 25 26 19   GLUCOSE 351* 238* 278* 856*  BUN 12 17 18  24*  CREATININE 0.98 1.21 1.28 1.44*  CALCIUM 9.1 8.8 9.1 9.9  MG 1.5 -- -- --  PHOS -- -- -- --   Liver Function Tests:  Lab 08/09/12 1432  AST 18  ALT 26  ALKPHOS 67  BILITOT 1.1  PROT 8.8*  ALBUMIN 4.4  CBC:  Lab 08/10/12 0247 08/09/12 1432  WBC 6.3 6.4  NEUTROABS -- 3.2  HGB 13.1 16.4  HCT 35.5* 45.1  MCV 82.8 83.7  PLT 126* 159   CBG:  Lab 08/11/12 1208 08/11/12 0810 08/10/12 2208 08/10/12 1637 08/10/12 1211  GLUCAP 383* 345* 373* 367* 122*    Recent Results (from the past 240 hour(s))  MRSA PCR SCREENING     Status: Normal   Collection Time   08/09/12  9:45 PM      Component Value Range Status Comment   MRSA by PCR NEGATIVE   NEGATIVE Final      Studies:  Recent x-ray studies have been reviewed in detail by the Attending Physician  Scheduled Meds:  Reviewed in detail by the Attending Physician   Clint Lipps Pager: [phone] 08/11/2012, 3:06 PM   On-Call/Text Page:      [email or web address]      password [email or web address]  If 7PM-7AM, please contact night-coverage [email or web address] Password [email or web address] 08/11/2012, 3:02 PM   LOS: 2 days

## 2012-08-12 LAB — BASIC METABOLIC PANEL
BUN: 10 mg/dL (ref 6–23)
CO2: 26 mEq/L (ref 19–32)
Calcium: 9.2 mg/dL (ref 8.4–10.5)
Chloride: 93 mEq/L — ABNORMAL LOW (ref 96–112)
Creatinine, Ser: 1.01 mg/dL (ref 0.50–1.35)
GFR calc Af Amer: >90 mL/min (ref >90)
GFR calc non Af Amer: 87 mL/min — ABNORMAL LOW (ref >90)
Glucose, Bld: 277 mg/dL — ABNORMAL HIGH (ref 70–99)
Potassium: 3.6 mEq/L (ref 3.5–5.1)
Sodium: 131 mEq/L — ABNORMAL LOW (ref 135–145)

## 2012-08-12 LAB — GLUCOSE, CAPILLARY
Glucose-Capillary: 279 mg/dL — ABNORMAL HIGH (ref 70–99)
Glucose-Capillary: 245 mg/dL — ABNORMAL HIGH (ref 70–99)

## 2012-08-12 LAB — CBC
HCT: 37.5 % — ABNORMAL LOW (ref 39.0–52.0)
Hemoglobin: 13.6 g/dL (ref 13.0–17.0)
MCH: 30.4 pg (ref 26.0–34.0)
MCHC: 36.3 g/dL — ABNORMAL HIGH (ref 30.0–36.0)
MCV: 83.7 fL (ref 78.0–100.0)
Platelets: 139 10*3/uL — ABNORMAL LOW (ref 150–400)
RBC: 4.48 MIL/uL (ref 4.22–5.81)
RDW: 12 % (ref 11.5–15.5)
WBC: 5.4 10*3/uL (ref 4.0–10.5)

## 2012-08-12 MED ORDER — "BD GETTING STARTED TAKE HOME KIT: 1ML X 30 G SYRINGES, "
1.0000 | Freq: Once | Status: DC
  Filled 2012-08-12 (×3): qty 1

## 2012-08-12 MED ORDER — "INSULIN SYRINGE-NEEDLE U-100 25G X 1"" 1 ML MISC": Status: DC

## 2012-08-12 MED ORDER — LIVING WELL WITH DIABETES BOOK
Freq: Once | Status: DC
  Filled 2012-08-12: qty 1

## 2012-08-12 MED ORDER — FENOFIBRATE 145 MG PO TABS: 145.0000 mg | ORAL_TABLET | Freq: Every day | ORAL | Status: DC

## 2012-08-12 MED ORDER — METFORMIN HCL 1000 MG PO TABS: 1000.0000 mg | ORAL_TABLET | Freq: Two times a day (BID) | ORAL | Status: DC

## 2012-08-12 MED ORDER — INSULIN NPH ISOPHANE & REGULAR (70-30) 100 UNIT/ML ~~LOC~~ SUSP: 45.0000 [IU] | Freq: Two times a day (BID) | SUBCUTANEOUS | Status: DC

## 2012-08-12 NOTE — Progress Notes (Signed)
   CARE MANAGEMENT NOTE 08/12/2012  Patient:  Joshua Petty, Joshua Petty   Account Number:  [id number]  Date Initiated:  08/10/2012  Documentation initiated by:  Donn Pierini  Subjective/Objective Assessment:   Pt admitted with *DM hyperosmolarity type II, uncontrolled     Action/Plan:   PTA pt vlived at home with family   Anticipated DC Date:  08/13/2012   Anticipated DC Plan:  HOME/SELF CARE      DC Planning Services  CM consult  Medication Assistance  MATCH Program      Choice offered to / List presented to:             Status of service:  Completed, signed off Medicare Important Message given?   (If response is "NO", the following Medicare IM given date fields will be blank) Date Medicare IM given:   Date Additional Medicare IM given:    Discharge Disposition:  HOME/SELF CARE  Per UR Regulation:  Reviewed for med. necessity/level of care/duration of stay  If discussed at Long Length of Stay Meetings, dates discussed:    Comments:  Elliot Cousin, RN Case Manager Signed CASE MANAGEMENT Progress Notes 08/12/2012 2:02 PM NCM spoke to pt and states he able to get his HCTZ from [facility] for $2.00. 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 Pt provided BD Getting Started Kit with syringes. Will provide pt with MATCH for his Tricor. Contacted [facility] and Tricor is $128 out of pocket. Notified attending MD for generic. Pt will need Tricor for home. Explained to pt that MATCH can be used once per year. Instructed him to take his Rx to [facility] to see if they will qualify after [facility] for discount with [facility]. Isidoro Donning RN CCM Case Mgmt phone [phone]

## 2012-08-12 NOTE — Discharge Summary (Signed)
Physician Discharge Summary  Joshua Petty ZOX:[id number] DOB: Aug 27, 1965 DOA: 08/09/2012  PCP: Quitman Livings MD, at [facility] clinic.  Admit date: 08/09/2012 Discharge date: 08/12/2012  Time spent: 40 minutes  Recommendations for Outpatient Follow-up:  1. Followup with primary care physician for further insulin dose adjustment  Discharge Diagnoses:  Principal Problem:  *Uncontrolled type 2 diabetes mellitus with hyperosmolar nonketotic hyperglycemia Active Problems:  Dehydration with hyponatremia  Acute renal failure  Hyperkalemia  Marijuana abuse  Obesity   Discharge Condition: Stable  Diet recommendation: Carbohydrate modified diet  Filed Weights   08/09/12 2110  Weight: 126.5 kg (278 lb 14.1 oz)    History of present illness:  Joshua Petty is a 47 y.o. male with history of HTN, HL presented to ED on 1/23 with 3 weeks history of progressively worsening polyuria (wakes up to 20 times at night), polydipsia, thirst, generalized weakness and blurred vision. He complains of burning of throat whenever he tries to eat or drink, associated nausea but no vomiting or abdominal pain, relieved to an extent by drinking cold liquids. Due to this, patient has only been drinking juices and eating fruits. No diarrhea. He gives history of head of penis being coated and irritative-apparently improved/resolved after using Vagisil. Recently had unprotected sex. HIV tested x2 (last one in December 2013) said to be negative. In ED, lab significant for sodium 121, potassium 5.4, creatinine 1.44, blood glucose 856. He is being bolused with 3 L of normal saline. The hospitalist service is requested to admit for further evaluation and management.   Hospital Course:   1. Hyperosmolar hyperglycemic nonketotic state: Patient presented to the hospital with blood glucose of 856, he did not have any evidence of acidosis, patient is not known diabetic. Patient started with aggressive hydration with IV fluids  in medial 3 L of normal saline were given in the emergency department. Patient admitted to step down low and the started on insulin drip. Blood sugar was trending down, but patient needed higher doses of insulin. Patient does not have insurance, so when he weaned off of the insulin drip he was started on NPH/regular insulin 70/30 mix. Patient discharged home on 45 units twice a day after his blood sugar was reasonable, of course patient will need further titration as outpatient, instructed to keep her sugar log and take it to his physician in one week for further adjustment of insulin dose.  2. Diabetes mellitus type 2: Diagnosis, hemoglobin A1c 14.2 which correlate with me plasma glucose of 361, patient discharged on 45 units of NPH/regular insulin 70/30 mix. He said he can afford that from the [facility] pharmacy, 1 vial costs about $25.  3. Acute renal failure: Creatinine at presentation 1.44, after aggressive IV fluid hydration creatinine back to normal.  4. Hypertension: Patient is on hydrochlorothiazide, blood pressure was controlled during this hospital stay, I will highly recommend to consider adding ACE inhibitor or ARB as outpatient.  5. Dyslipidemia: With high total cholesterol of 247 triglycerides of 1025, HDL of 27. LDL cannot be calculated because of high triglycerides, patient placed on TriCor, this is likely secondary to his uncontrolled diabetes mellitus, and should be improving with insulin therapy.  Procedures:  None  Consultations:  None  Discharge Exam: Filed Vitals:   08/11/12 1443 08/11/12 2119 08/12/12 0444 08/12/12 1130  BP: 125/92 115/75 118/81 144/95  Pulse: 98 103 96   Temp: 98.4 F (36.9 C) 98.2 F (36.8 C) 97.4 F (36.3 C)   TempSrc: Oral Oral Oral  Resp: 20 20 18    Height:      Weight:      SpO2: 95% 93% 95%    General: Alert and awake, oriented x3, not in any acute distress. HEENT: anicteric sclera, pupils reactive to light and accommodation,  EOMI CVS: S1-S2 clear, no murmur rubs or gallops Chest: clear to auscultation bilaterally, no wheezing, rales or rhonchi Abdomen: soft nontender, nondistended, normal bowel sounds, no organomegaly Extremities: no cyanosis, clubbing or edema noted bilaterally Neuro: Cranial nerves II-XII intact, no focal neurological deficits  Discharge Instructions  Discharge Orders    Future Orders Please Complete By Expires   Diet Carb Modified      Increase activity slowly          Medication List     As of 08/12/2012 12:42 PM    STOP taking these medications         TYLENOL COLD/FLU SEVERE PO      TAKE these medications         fenofibrate 145 MG tablet   Commonly known as: TRICOR   Take 1 tablet (145 mg total) by mouth daily.      hydrochlorothiazide 25 MG tablet   Commonly known as: HYDRODIURIL   Take 25 mg by mouth daily.      insulin NPH-insulin regular (70-30) 100 UNIT/ML injection   Commonly known as: NOVOLIN 70/30   Inject 45 Units into the skin 2 (two) times daily with a meal.      metFORMIN 1000 MG tablet   Commonly known as: GLUCOPHAGE   Take 1 tablet (1,000 mg total) by mouth 2 (two) times daily with a meal.          The results of significant diagnostics from this hospitalization (including imaging, microbiology, ancillary and laboratory) are listed below for reference.    Significant Diagnostic Studies: No results found.  Microbiology: Recent Results (from the past 240 hour(s))  MRSA PCR SCREENING     Status: Normal   Collection Time   08/09/12  9:45 PM      Component Value Range Status Comment   MRSA by PCR NEGATIVE  NEGATIVE Final      Labs: Basic Metabolic Panel:  Lab 08/12/12 1610 08/11/12 0630 08/10/12 0247 08/09/12 2140 08/09/12 1432  NA 131* 130* 133* 132* 121*  K 3.6 4.2 2.9* 3.6 5.4*  CL 93* 94* 97 93* 80*  CO2 26 22 25 26 19   GLUCOSE 277* 351* 238* 278* 856*  BUN 10 12 17 18  24*  CREATININE 1.01 0.98 1.21 1.28 1.44*  CALCIUM 9.2 9.1 8.8  9.1 9.9  MG -- 1.5 -- -- --  PHOS -- -- -- -- --   Liver Function Tests:  Lab 08/09/12 1432  AST 18  ALT 26  ALKPHOS 67  BILITOT 1.1  PROT 8.8*  ALBUMIN 4.4   No results found for this basename: LIPASE:5,AMYLASE:5 in the last 168 hours No results found for this basename: AMMONIA:5 in the last 168 hours CBC:  Lab 08/12/12 0740 08/10/12 0247 08/09/12 1432  WBC 5.4 6.3 6.4  NEUTROABS -- -- 3.2  HGB 13.6 13.1 16.4  HCT 37.5* 35.5* 45.1  MCV 83.7 82.8 83.7  PLT 139* 126* 159   Cardiac Enzymes: No results found for this basename: CKTOTAL:5,CKMB:5,CKMBINDEX:5,TROPONINI:5 in the last 168 hours BNP: BNP (last 3 results) No results found for this basename: PROBNP:3 in the last 8760 hours CBG:  Lab 08/12/12 1231 08/12/12 0737 08/11/12 2118 08/11/12 1734 08/11/12 1208  GLUCAP 245* 279* 209* 274* 383*       Signed:  Prabhjot Maddux A  [facility] 08/12/2012, 12:42 PM

## 2012-08-12 NOTE — Progress Notes (Signed)
NCM spoke to pt and states he able to get his HCTZ from [facility] for $2.00. 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 Pt provided BD Getting Started Kit with syringes. Will provide pt with MATCH for his Tricor. Contacted [facility] and Tricor is $128 out of pocket. Notified attending MD for generic. Pt will need Tricor for home. Explained to pt that MATCH can be used once per year. Instructed him to take his Rx to [facility] to see if they will qualify after [facility] for discount with [facility]. Isidoro Donning RN CCM Case Mgmt phone [phone]

## 2015-01-26 ENCOUNTER — Encounter (HOSPITAL_COMMUNITY): Payer: Self-pay | Admitting: Emergency Medicine

## 2015-01-26 ENCOUNTER — Emergency Department (HOSPITAL_COMMUNITY)
Admission: EM | Admit: 2015-01-26 | Discharge: 2015-01-26 | Disposition: A | Discharge status: Home / Self Care | Payer: Self-pay | Attending: Emergency Medicine | Admitting: Emergency Medicine

## 2015-01-26 DIAGNOSIS — E785 Hyperlipidemia, unspecified: Secondary | ICD-10-CM | POA: Insufficient documentation

## 2015-01-26 DIAGNOSIS — I10 Essential (primary) hypertension: Secondary | ICD-10-CM | POA: Insufficient documentation

## 2015-01-26 DIAGNOSIS — Z8619 Personal history of other infectious and parasitic diseases: Secondary | ICD-10-CM | POA: Insufficient documentation

## 2015-01-26 DIAGNOSIS — Z760 Encounter for issue of repeat prescription: Secondary | ICD-10-CM | POA: Insufficient documentation

## 2015-01-26 DIAGNOSIS — Z79899 Other long term (current) drug therapy: Secondary | ICD-10-CM | POA: Insufficient documentation

## 2015-01-26 DIAGNOSIS — Z794 Long term (current) use of insulin: Secondary | ICD-10-CM | POA: Insufficient documentation

## 2015-01-26 LAB — CBG MONITORING, ED: Glucose-Capillary: 90 mg/dL (ref 65–99)

## 2015-01-26 MED ORDER — METOPROLOL TARTRATE 100 MG PO TABS: 100.0000 mg | ORAL_TABLET | Freq: Two times a day (BID) | ORAL | Status: DC

## 2015-01-26 MED ORDER — LISINOPRIL 20 MG PO TABS: 20.0000 mg | ORAL_TABLET | Freq: Every day | ORAL | Status: DC

## 2015-01-26 MED ORDER — HYDROCHLOROTHIAZIDE 25 MG PO TABS: 25.0000 mg | ORAL_TABLET | Freq: Every day | ORAL | Status: DC

## 2015-01-26 MED ORDER — AMLODIPINE BESYLATE 10 MG PO TABS: 10.0000 mg | ORAL_TABLET | Freq: Every day | ORAL | Status: DC

## 2015-01-26 NOTE — ED Notes (Signed)
PT reports his last does of B/P meds was 2 months ago

## 2015-01-26 NOTE — ED Notes (Signed)
Pt states he needs 4 medications refilled, Metoprolol, HCTZ, amlodipine, and lisinopril. Pt states he hasn't been taking his BP medications for the last month. Pt attempted to see his PCP and PCP no longer living locally. Pt denies any symptoms at this time.

## 2015-01-26 NOTE — ED Notes (Addendum)
Metoprolol 25mg  1 PO every day, amlodipine 10mg  1 PO every morning  ,hydrochlorothiazide 25mg  1 tab PO every morning . Lisinopril 20 mg 1 PO every morning Pt [facility].

## 2015-01-26 NOTE — ED Provider Notes (Signed)
CSN: [id number]     Arrival date & time 01/26/15  1145 History  This chart was scribed for non-physician practitioner, Cheron SchaumannLeslie Sloane Palmer, PA-C working with Gwyneth SproutWhitney Plunkett, MD by Placido SouLogan Joldersma, ED scribe. This patient was seen in room TR04C/TR04C and the patient's care was started at 1:10 PM.     Chief Complaint  Patient presents with  . Medication Refill   The history is provided by the patient. No language interpreter was used.    HPI Comments: Joshua Petty is a 49 y.o. male, with a history of HTN and HLD, who presents to the Emergency Department for multiple Rx refills. Pt notes recently being discharged from prison and requests refills for his Rx's as well as a referral for a PCP. He denies any known drug allergies as well as any other known health conditions. Pt denies having any current physical complaints.   Past Medical History  Diagnosis Date  . Hypertension   . Hyperlipidemia   . Gonorrhea    Past Surgical History  Procedure Laterality Date  . No past surgeries     Family History  Problem Relation Age of Onset  . Diabetes Father   . Diabetes Sister    History  Substance Use Topics  . Smoking status: Never Smoker   . Smokeless tobacco: Not on file  . Alcohol Use: No    Review of Systems  Constitutional: Negative for fever and chills.  Psychiatric/Behavioral: Negative for behavioral problems and confusion.      Allergies  Review of patient's allergies indicates no known allergies.  Home Medications   Prior to Admission medications   Medication Sig Start Date End Date Taking? Authorizing Provider  fenofibrate (TRICOR) 145 MG tablet Take 1 tablet (145 mg total) by mouth daily. 08/12/12   Clydia LlanoMutaz Elmahi, MD  hydrochlorothiazide (HYDRODIURIL) 25 MG tablet Take 25 mg by mouth daily.    Historical Provider, MD  insulin NPH-insulin regular (NOVOLIN 70/30) (70-30) 100 UNIT/ML injection Inject 45 Units into the skin 2 (two) times daily with a meal. 08/12/12   Clydia LlanoMutaz  Elmahi, MD  Insulin Syringe-Needle U-100 25G X 1" 1 ML MISC 1 month supply 08/12/12   Clydia LlanoMutaz Elmahi, MD  metFORMIN (GLUCOPHAGE) 1000 MG tablet Take 1 tablet (1,000 mg total) by mouth 2 (two) times daily with a meal. 08/12/12   Mutaz Elmahi, MD   BP 170/123 mmHg  Pulse 76  Temp(Src) 98 F (36.7 C) (Oral)  Resp 16  Ht 5\' 7"  (1.702 m)  Wt 270 lb (122.471 kg)  BMI 42.28 kg/m2  SpO2 97% Physical Exam  Constitutional: He is oriented to person, place, and time. He appears well-developed and well-nourished. No distress.  HENT:  Head: Normocephalic and atraumatic.  Mouth/Throat: Oropharynx is clear and moist.  Eyes: Conjunctivae and EOM are normal. Pupils are equal, round, and reactive to light.  Neck: Normal range of motion. Neck supple. No tracheal deviation present.  Cardiovascular: Normal rate.   Pulmonary/Chest: Breath sounds normal. No respiratory distress.  Abdominal: Soft.  Musculoskeletal: Normal range of motion.  Neurological: He is alert and oriented to person, place, and time.  Skin: Skin is warm and dry.  Psychiatric: He has a normal mood and affect. His behavior is normal.  Nursing note and vitals reviewed.   ED Course  Procedures  DIAGNOSTIC STUDIES: Oxygen Saturation is 97% on RA, normal by my interpretation.    COORDINATION OF CARE: 1:12 PM Discussed treatment plan with pt at bedside and pt agreed to plan.  Labs Review Labs Reviewed - No data to display  Imaging Review No results found.   EKG Interpretation None      MDM   Final diagnoses:  Medication refill   Meds ordered this encounter  Medications  . hydrochlorothiazide (HYDRODIURIL) 25 MG tablet    Sig: Take 1 tablet (25 mg total) by mouth daily.    Dispense:  30 tablet    Refill:  2    Order Specific Question:  Supervising Provider    Answer:  MILLER, BRIAN [[id number]]  . amLODipine (NORVASC) 10 MG tablet    Sig: Take 1 tablet (10 mg total) by mouth daily.    Dispense:  30 tablet    Refill:  1     Order Specific Question:  Supervising Provider    Answer:  MILLER, BRIAN [[id number]]  . metoprolol (LOPRESSOR) 100 MG tablet    Sig: Take 1 tablet (100 mg total) by mouth 2 (two) times daily.    Dispense:  30 tablet    Refill:  1    Order Specific Question:  Supervising Provider    Answer:  MILLER, BRIAN [[id number]]  . lisinopril (PRINIVIL,ZESTRIL) 20 MG tablet    Sig: Take 1 tablet (20 mg total) by mouth daily.    Dispense:  30 tablet    Refill:  1    Order Specific Question:  Supervising Provider    Answer:  Eber Hong [[id number]]     I personally performed the services in this documentation, which was scribed in my presence.  The recorded information has been reviewed and considered.   Barnet Pall.  Lonia Skinner [place], PA-C 01/26/15 1411  Gwyneth Sprout, MD 01/26/15 [phone]

## 2015-01-26 NOTE — ED Notes (Signed)
Declined W/C at D/C and was escorted to lobby by RN. 

## 2015-01-26 NOTE — Care Management Note (Addendum)
Case Management Note  Patient Details  Name: Joshua Petty Date of Birth: [birth year]  Subjective/Objective:                89 uninsured male recently d/c for prison as Cm [facility], Joshua Petty via voice message.  Joshua Petty inquired about assistance with pcp and medication for pt    Action/Plan: Spoke with pt via phone to [place].  CM discussed and provided written information for uninsured accepting pcps, discussed the importance of pcp vs EDP services for f/u care,  [email or web address], discounted pharmacies and other [place] resources such as [facility] , [facility], affordable care act, financial assistance,   Reviewed resources for [place] accepting pcps like  [facility], [facility], general medical clinics,  medication resources, Pt voiced understanding and appreciation of resources provided   Provided [facility] contact information Pt agreed to a referral Cm completed referral Pt to be contact by [facility] clinical liason   Expected Discharge Date:   January 26 2015               Expected Discharge Plan:    home with a pending [facility] to [facility], resource information in d/c instructions for [facility], [facility], [email or web address]       Additional Comments:  Joshua Petty, Joshua Heikes Louise, RN 01/26/2015, 2:01 PM

## 2015-01-26 NOTE — Discharge Instructions (Signed)
Medication Refill, Emergency Department °We have refilled your medication today as a courtesy to you. It is best for your medical care, however, to take care of getting refills done through your primary caregiver's office. They have your records and can do a better job of follow-up than we can in the emergency department. °On maintenance medications, we often only prescribe enough medications to get you by until you are able to see your regular caregiver. This is a more expensive way to refill medications. °In the future, please plan for refills so that you will not have to use the emergency department for this. °Thank you for your help. Your help allows us to better take care of the daily emergencies that enter our department. °Document Released: 10/21/2003 Document Revised: 09/26/2011 Document Reviewed: 10/11/2013 °ExitCare® Patient Information ©2015 [employer]. This information is not intended to replace advice given to you by your health care provider. Make sure you discuss any questions you have with your health care provider. ° ° °Emergency Department Resource Guide °1) Find a Doctor and Pay Out of Pocket °Although you won't have to find out who is covered by your insurance plan, it is a good idea to ask around and get recommendations. You will then need to call the office and see if the doctor you have chosen will accept you as a new patient and what types of options they offer for patients who are self-pay. Some doctors offer discounts or will set up payment plans for their patients who do not have insurance, but you will need to ask so you aren't surprised when you get to your appointment. ° °2) Contact Your Local Health Department °Not all health departments have doctors that can see patients for sick visits, but many do, so it is worth a call to see if yours does. If you don't know where your local health department is, you can check in your phone book. The CDC also has a tool to help you locate your  state's health department, and many state websites also have listings of all of their local health departments. ° °3) Find a Walk-in Clinic °If your illness is not likely to be very severe or complicated, you may want to try a walk in clinic. These are popping up all over the country in pharmacies, drugstores, and shopping centers. They're usually staffed by nurse practitioners or physician assistants that have been trained to treat common illnesses and complaints. They're usually fairly quick and inexpensive. However, if you have serious medical issues or chronic medical problems, these are probably not your best option. ° °No Primary Care Doctor: °- Call [facility] at  832-8000 - they can help you locate a primary care doctor that  accepts your insurance, provides certain services, etc. °- Physician Referral Service- 1-800-533-3463 ° °Chronic Pain Problems: °Organization         Address  Phone   Notes  °[facility]  (336) 297-2271 Patients need to be referred by their primary care doctor.  ° °Medication Assistance: °Organization         Address  Phone   Notes  °[place] [facility] [address] °[place], [place] [address] (336) 641-8030 --Must be a resident of [place] °-- Must have NO insurance coverage whatsoever (no Medicaid/ Medicare, etc.) °-- The pt. MUST have a primary care doctor that directs their care regularly and follows them in the community °  °[facility]  (866) 331-1348   °[employer]  (888) 892-1162   ° °Agencies that provide inexpensive medical care: °Organization         Address  Phone   Notes  °[facility]  (336) 832-8035   °[facility]    (336) 832-7272   °[facility] [address] °[place], [place] [address] (336) 832-4777   °[facility] [address], °[place] (336) 271-4999   °[facility]    (336) 373-0678   °[facility]    (336)  272-1050   °[facility] ° [address], [place] Phone:  (336) 832-4444, Fax:  (336) 832-4440 Hours of Operation:  9 am - 6 pm, M-F.  Also accepts Medicaid/Medicare and self-pay.  °[facility] ° [address], [place] Phone: (336) 832-3150, Fax: (336) 832-3151. Hours of Operation:  8:30 am - 5:30 pm, M-F.  Also accepts Medicaid and self-pay.  °[facility] [place] [address], [place] Phone: (336) 878-6027   °[facility] [address], [place], [place] (336)723-1848, Ext. 123 Mondays & Thursdays: 7-9 AM.  First 15 patients are seen on a first come, first serve basis. °  ° °Medicaid-accepting [place] Providers: ° °Organization         Address  Phone   Notes  °[facility] [address], [place] (336) 641-2100 Also accepts self-pay patients.  °[facility] [address], [place] ° (336) 856-9996   °[facility] [address], [place] (336) 288-8857   °[facility] [address] [place] [address], [place] (336) 299-7000   °Veita Bland [address], [place]  ° (336) 373-1557 Only accepts [place] Access Medicaid patients after they have their name applied to their card.  ° °Self-Pay (no insurance) in [place]: ° °Organization         Address  Phone   Notes  °Sickle Cell Patients, [facility] [address], [place] (336) 832-1970   °[facility] [address], [place] (336) 832-4400   °[facility] ° [address], [place] (336) 992-4800   °[facility]/Dr. Osei-Bonsu ° [address] [place] [address], [place] or [address], [place] (336) 841-8500 Phone number for both [place] and [place] locations is the same.  °[facility] [address], [place] (336)  299-0000   °[facility] [address] [place] [address], [place] or [address] (336) 852-7530 °(336) 878-2260   °[facility] [address], [place] (336) 350-1642, phone; (336) 294-5005, fax Sees patients 1st and 3rd Saturday of every month.  Must not qualify for public or private insurance (i.e. Medicaid, Medicare, [place] Health Choice, Veterans' Benefits) • Household income should be no more than 200% of the poverty level •The clinic cannot treat you if you are pregnant or think you are pregnant • Sexually transmitted diseases are not treated at the clinic.  ° ° °Dental Care: °Organization         Address  Phone  Notes  °[facility] [address],  (336) 641-6152 Accepts children up to age 21 who are enrolled in Medicaid or [place] Health Choice; pregnant women with a Medicaid card; and   children who have applied for Medicaid or [place] Health Choice, but were declined, whose parents can pay a reduced fee at time of service.  °[facility] [place]  [address], [place] (336) 641-7733 Accepts children up to age 21 who are enrolled in Medicaid or [place] Health Choice; pregnant women with a Medicaid card; and children who have applied for Medicaid or [place] Health Choice, but were declined, whose parents can pay a reduced fee at time of service.  °[facility] ° [address], [place] (336) 641-4533 Patients are seen by appointment only. Walk-ins are not accepted. [facility] will see patients 18 years of age and older. °Monday - Tuesday (8am-5pm) °Most Wednesdays (8:30-5pm) °$30 per visit, cash only  °[facility] ° [address], [place] (336) 641-4533 Patients are seen by appointment only. Walk-ins are not accepted. [facility] will see patients 18 years of age and older. °One Wednesday Evening (Monthly: Volunteer  Based).  $30 per visit, cash only  °[facility]  (919) 537-3737 for adults; Children under age 4, call Graduate Pediatric Dentistry at ([phone]) [phone]. Children aged 4-14, please call ([phone]) [phone] to request a pediatric application. ° Dental services are provided in all areas of dental care including fillings, crowns and bridges, complete and partial dentures, implants, gum treatment, root canals, and extractions. Preventive care is also provided. Treatment is provided to both adults and children. °Patients are selected via a lottery and there is often a waiting list. °  °[facility] [address], °[place] ° (336) 763-8833 [email or web address] °  °[facility] [address], [place], [place] (336)723-1848, Ext. 123 Second and Fourth Thursday of each month, opens at 6:30 AM; Clinic ends at 9 AM.  Patients are seen on a first-come first-served basis, and a limited number are seen during each clinic.  ° °[facility] ° [address], [place], [place] (336) 723-7904   Eligibility Requirements °You must have lived in [place], [place], or [place] counties for at least the last three months. °  You cannot be eligible for state or federal sponsored healthcare insurance, including Veterans Administration, Medicaid, or Medicare. °  You generally cannot be eligible for healthcare insurance through your employer.  °  How to apply: °Eligibility screenings are held every Tuesday and Wednesday afternoon from 1:00 pm until 4:00 pm. You do not need an appointment for the interview!  °[facility] [address], [place], [place] 336-631-2330   °[facility]  336-342-8273   °[facility]  336-703-3100   °[facility]  336-570-6415   ° °Behavioral Health Resources in the Community: °Intensive Outpatient Programs °Organization         Address  Phone  Notes  °[facility] [address], [place], [place] 336-878-6098   °[facility] [address], [place], [place] 336-832-9800   °[facility] [address], [place],  ° 336-882-2125   °[facility] [address],  °[place],  1-800-853-5163 or 336-641-4981   °Substance Abuse Resources °Organization         Address  Phone  Notes  °[facility]  336-882-2125   °[facility]  336-784-9470   °[facility]  336-285-9073   °[facility]  336-845-3988   °Residential &   Outpatient Substance Abuse Program  1-800-659-3381   °Psychological Services °Organization         Address  Phone  Notes  °[facility]  336- 832-9600   °[facility]  336- 378-7881   °[facility] [address], [place] 1-800-853-5163 or 336-641-4981   ° °Mobile Crisis Teams °Organization         Address  Phone  Notes  °[facility], Mobile Crisis Care Unit  1-877-626-1772   °Assertive °[facility] ° [address] [place], [place] 336-834-9664   °Sharon DeEsch [address] °[place] 336-554-5454   ° °Self-Help/Support Groups °Organization         Address  Phone             Notes  °[facility] - variety of support groups  336- 373-1402 Call for more information  °Narcotics Anonymous (NA), [facility] [address], °[place]  2 meetings at this location  ° °Residential Treatment Programs °Organization         Address  Phone  Notes  °[facility] [address],    °[place]  1-866-801-8205   °[facility] ° [address], [place], [place] 704-293-8524   °[facility] [address], [place] 336-845-3988 Admissions: 8am-3pm M-F  °[facility] [address]    °[place], [place] 336-841-1104   °[facility] [address], [place], [place] 336-379-7146    °[facility] [address]  °[place], [place] 336-285-9073   °[facility] - Intensive Outpatient [address], [place], [place] 336-852-3033   °[facility] [address]  °[place], [place] 1-877-615-2722 or 336-784-9470   °[facility] [address] [place], [place] 336-227-7417 Accepts Medicaid  °[facility] [address]  °[place] 1-800-659-3381 Substance Abuse/Addiction Treatment  ° °[place] [facility]al Health Resources °Organization         Address  Phone  Notes  °[facility]  (888) 581-9988   °Julie Brannon, PhD [address] [place], [place]   (336) 349-5553 or (336) 951-0000   °[facility]   [address] °[place], [place] (336) 349-4454   °[facility] [address], [place], [place] (336) 342-8316 Insurance/Medicaid/sponsorship through [facility]  °[facility] [address]                                    [place], [place] (336) 342-8316 Therapy/tele-psych/case  °[facility] [address]  ° [place], [place] (336) 349-2233    °Dr. Arfeen  (336) 349-4544   °[facility] [place]  [employer] [place] [facility] 1) [address], [place] °2) [address], [place] °3)  [address], [place] (336) 349-3220 °(336) 342-7768 ° °(336) 342-8140   °[place] [facility] (336) 342-1394 or (336) 342-3537 (After Hours)    ° ° ° °

## 2015-10-12 ENCOUNTER — Ambulatory Visit: Payer: Self-pay | Admitting: Internal Medicine

## 2016-01-09 ENCOUNTER — Encounter (HOSPITAL_COMMUNITY): Payer: Self-pay

## 2016-01-09 ENCOUNTER — Emergency Department (HOSPITAL_COMMUNITY)
Admission: EM | Admit: 2016-01-09 | Discharge: 2016-01-09 | Disposition: A | Discharge status: Home / Self Care | Payer: Self-pay | Attending: Emergency Medicine | Admitting: Emergency Medicine

## 2016-01-09 DIAGNOSIS — I10 Essential (primary) hypertension: Secondary | ICD-10-CM | POA: Insufficient documentation

## 2016-01-09 DIAGNOSIS — Z7984 Long term (current) use of oral hypoglycemic drugs: Secondary | ICD-10-CM | POA: Insufficient documentation

## 2016-01-09 DIAGNOSIS — J0100 Acute maxillary sinusitis, unspecified: Secondary | ICD-10-CM | POA: Insufficient documentation

## 2016-01-09 DIAGNOSIS — Z794 Long term (current) use of insulin: Secondary | ICD-10-CM | POA: Insufficient documentation

## 2016-01-09 DIAGNOSIS — E1165 Type 2 diabetes mellitus with hyperglycemia: Secondary | ICD-10-CM | POA: Insufficient documentation

## 2016-01-09 DIAGNOSIS — R000 Tachycardia, unspecified: Secondary | ICD-10-CM | POA: Insufficient documentation

## 2016-01-09 DIAGNOSIS — R739 Hyperglycemia, unspecified: Secondary | ICD-10-CM

## 2016-01-09 LAB — URINE MICROSCOPIC-ADD ON

## 2016-01-09 LAB — CBC WITH DIFFERENTIAL/PLATELET
Basophils Absolute: 0 10*3/uL (ref 0.0–0.1)
Basophils Relative: 1 %
Eosinophils Absolute: 0.1 10*3/uL (ref 0.0–0.7)
Eosinophils Relative: 2 %
HCT: 43.7 % (ref 39.0–52.0)
Hemoglobin: 15.6 g/dL (ref 13.0–17.0)
Lymphocytes Relative: 37 %
Lymphs Abs: 2.4 10*3/uL (ref 0.7–4.0)
MCH: 29.4 pg (ref 26.0–34.0)
MCHC: 35.7 g/dL (ref 30.0–36.0)
MCV: 82.5 fL (ref 78.0–100.0)
Monocytes Absolute: 0.4 10*3/uL (ref 0.1–1.0)
Monocytes Relative: 6 %
Neutro Abs: 3.5 10*3/uL (ref 1.7–7.7)
Neutrophils Relative %: 54 %
Platelets: 152 10*3/uL (ref 150–400)
RBC: 5.3 MIL/uL (ref 4.22–5.81)
RDW: 12.3 % (ref 11.5–15.5)
WBC: 6.4 10*3/uL (ref 4.0–10.5)

## 2016-01-09 LAB — COMPREHENSIVE METABOLIC PANEL
ALT: 22 U/L (ref 17–63)
AST: 22 U/L (ref 15–41)
Albumin: 4 g/dL (ref 3.5–5.0)
Alkaline Phosphatase: 67 U/L (ref 38–126)
Anion gap: 13 (ref 5–15)
BUN: 17 mg/dL (ref 6–20)
CO2: 22 mmol/L (ref 22–32)
Calcium: 9.5 mg/dL (ref 8.9–10.3)
Chloride: 95 mmol/L — ABNORMAL LOW (ref 101–111)
Creatinine, Ser: 1.52 mg/dL — ABNORMAL HIGH (ref 0.61–1.24)
GFR calc Af Amer: >60 mL/min (ref >60)
GFR calc non Af Amer: 52 mL/min — ABNORMAL LOW (ref >60)
Glucose, Bld: 471 mg/dL — ABNORMAL HIGH (ref 65–99)
Potassium: 4.3 mmol/L (ref 3.5–5.1)
Sodium: 130 mmol/L — ABNORMAL LOW (ref 135–145)
Total Bilirubin: 1.5 mg/dL — ABNORMAL HIGH (ref 0.3–1.2)
Total Protein: 8.1 g/dL (ref 6.5–8.1)

## 2016-01-09 LAB — URINALYSIS, ROUTINE W REFLEX MICROSCOPIC
Bilirubin Urine: NEGATIVE
Glucose, UA: >1000 mg/dL — AB
Ketones, ur: 40 mg/dL — AB
Leukocytes, UA: NEGATIVE
Nitrite: NEGATIVE
Protein, ur: >300 mg/dL — AB
Specific Gravity, Urine: 1.038 — ABNORMAL HIGH (ref 1.005–1.030)
pH: 5 (ref 5.0–8.0)

## 2016-01-09 LAB — CBG MONITORING, ED
Glucose-Capillary: 475 mg/dL — ABNORMAL HIGH (ref 65–99)
Glucose-Capillary: 398 mg/dL — ABNORMAL HIGH (ref 65–99)

## 2016-01-09 MED ORDER — SODIUM CHLORIDE 0.9 % IV BOLUS (SEPSIS)
1000.0000 mL | Freq: Once | INTRAVENOUS | Status: AC
Start: 2016-01-09 — End: 2016-01-09
  Administered 2016-01-09: 1000 mL via INTRAVENOUS

## 2016-01-09 MED ORDER — SODIUM CHLORIDE 0.9 % IV BOLUS (SEPSIS)
1000.0000 mL | Freq: Once | INTRAVENOUS | Status: AC
  Administered 2016-01-09: 1000 mL via INTRAVENOUS

## 2016-01-09 MED ORDER — DOXYCYCLINE HYCLATE 100 MG PO CAPS: 100.0000 mg | ORAL_CAPSULE | Freq: Two times a day (BID) | ORAL | Status: DC

## 2016-01-09 MED ORDER — METFORMIN HCL 500 MG PO TABS: 500.0000 mg | ORAL_TABLET | Freq: Two times a day (BID) | ORAL | Status: DC

## 2016-01-09 MED ORDER — AMLODIPINE BESYLATE 10 MG PO TABS: 10.0000 mg | ORAL_TABLET | Freq: Every day | ORAL | Status: DC

## 2016-01-09 NOTE — ED Notes (Addendum)
Patient here with ongoing nasal congestion and ears stopped up for the past month. Also concerned that he may have yeast infection in groin. Out of BP meds. Was taken off diabetes meds in 2014. Does report increased thirst and urination

## 2016-01-09 NOTE — ED Notes (Signed)
Pt ambulated to room with no issues, appeared steady on his feet.

## 2016-01-09 NOTE — Discharge Instructions (Signed)
Hyperglycemia °Hyperglycemia occurs when the glucose (sugar) in your blood is too high. Hyperglycemia can happen for many reasons, but it most often happens to people who do not know they have diabetes or are not managing their diabetes properly.  °CAUSES  °Whether you have diabetes or not, there are other causes of hyperglycemia. Hyperglycemia can occur when you have diabetes, but it can also occur in other situations that you might not be as aware of, such as: °Diabetes °· If you have diabetes and are having problems controlling your blood glucose, hyperglycemia could occur because of some of the following reasons: °· Not following your meal plan. °· Not taking your diabetes medications or not taking it properly. °· Exercising less or doing less activity than you normally do. °· Being sick. °Pre-diabetes °· This cannot be ignored. Before people develop Type 2 diabetes, they almost always have "pre-diabetes." This is when your blood glucose levels are higher than normal, but not yet high enough to be diagnosed as diabetes. Research has shown that some long-term damage to the body, especially the heart and circulatory system, may already be occurring during pre-diabetes. If you take action to manage your blood glucose when you have pre-diabetes, you may delay or prevent Type 2 diabetes from developing. °Stress °· If you have diabetes, you may be "diet" controlled or on oral medications or insulin to control your diabetes. However, you may find that your blood glucose is higher than usual in the hospital whether you have diabetes or not. This is often referred to as "stress hyperglycemia." Stress can elevate your blood glucose. This happens because of hormones put out by the body during times of stress. If stress has been the cause of your high blood glucose, it can be followed regularly by your caregiver. That way he/she can make sure your hyperglycemia does not continue to get worse or progress to  diabetes. °Steroids °· Steroids are medications that act on the infection fighting system (immune system) to block inflammation or infection. One side effect can be a rise in blood glucose. Most people can produce enough extra insulin to allow for this rise, but for those who cannot, steroids make blood glucose levels go even higher. It is not unusual for steroid treatments to "uncover" diabetes that is developing. It is not always possible to determine if the hyperglycemia will go away after the steroids are stopped. A special blood test called an A1c is sometimes done to determine if your blood glucose was elevated before the steroids were started. °SYMPTOMS °· Thirsty. °· Frequent urination. °· Dry mouth. °· Blurred vision. °· Tired or fatigue. °· Weakness. °· Sleepy. °· Tingling in feet or leg. °DIAGNOSIS  °Diagnosis is made by monitoring blood glucose in one or all of the following ways: °· A1c test. This is a chemical found in your blood. °· Fingerstick blood glucose monitoring. °· Laboratory results. °TREATMENT  °First, knowing the cause of the hyperglycemia is important before the hyperglycemia can be treated. Treatment may include, but is not be limited to: °· Education. °· Change or adjustment in medications. °· Change or adjustment in meal plan. °· Treatment for an illness, infection, etc. °· More frequent blood glucose monitoring. °· Change in exercise plan. °· Decreasing or stopping steroids. °· Lifestyle changes. °HOME CARE INSTRUCTIONS  °· Test your blood glucose as directed. °· Exercise regularly. Your caregiver will give you instructions about exercise. Pre-diabetes or diabetes which comes on with stress is helped by exercising. °· Eat wholesome,   balanced meals. Eat often and at regular, fixed times. Your caregiver or nutritionist will give you a meal plan to guide your sugar intake.  Being at an ideal weight is important. If needed, losing as little as 10 to 15 pounds may help improve blood  glucose levels. SEEK MEDICAL CARE IF:   You have questions about medicine, activity, or diet.  You continue to have symptoms (problems such as increased thirst, urination, or weight gain). SEEK IMMEDIATE MEDICAL CARE IF:   You are vomiting or have diarrhea.  Your breath smells fruity.  You are breathing faster or slower.  You are very sleepy or incoherent.  You have numbness, tingling, or pain in your feet or hands.  You have chest pain.  Your symptoms get worse even though you have been following your caregiver's orders.  If you have any other questions or concerns.   This information is not intended to replace advice given to you by your health care provider. Make sure you discuss any questions you have with your health care provider.   Document Released: 12/28/2000 Document Revised: 09/26/2011 Document Reviewed: 03/10/2015 Elsevier Interactive Patient Education 2016 Elsevier Inc.  Sinusitis, Adult Sinusitis is redness, soreness, and inflammation of the paranasal sinuses. Paranasal sinuses are air pockets within the bones of your face. They are located beneath your eyes, in the middle of your forehead, and above your eyes. In healthy paranasal sinuses, mucus is able to drain out, and air is able to circulate through them by way of your nose. However, when your paranasal sinuses are inflamed, mucus and air can become trapped. This can allow bacteria and other germs to grow and cause infection. Sinusitis can develop quickly and last only a short time (acute) or continue over a long period (chronic). Sinusitis that lasts for more than 12 weeks is considered chronic. CAUSES Causes of sinusitis include:  Allergies.  Structural abnormalities, such as displacement of the cartilage that separates your nostrils (deviated septum), which can decrease the air flow through your nose and sinuses and affect sinus drainage.  Functional abnormalities, such as when the small hairs (cilia)  that line your sinuses and help remove mucus do not work properly or are not present. SIGNS AND SYMPTOMS Symptoms of acute and chronic sinusitis are the same. The primary symptoms are pain and pressure around the affected sinuses. Other symptoms include:  Upper toothache.  Earache.  Headache.  Bad breath.  Decreased sense of smell and taste.  A cough, which worsens when you are lying flat.  Fatigue.  Fever.  Thick drainage from your nose, which often is green and may contain pus (purulent).  Swelling and warmth over the affected sinuses. DIAGNOSIS Your health care provider will perform a physical exam. During your exam, your health care provider may perform any of the following to help determine if you have acute sinusitis or chronic sinusitis:  Look in your nose for signs of abnormal growths in your nostrils (nasal polyps).  Tap over the affected sinus to check for signs of infection.  View the inside of your sinuses using an imaging device that has a light attached (endoscope). If your health care provider suspects that you have chronic sinusitis, one or more of the following tests may be recommended:  Allergy tests.  Nasal culture. A sample of mucus is taken from your nose, sent to a lab, and screened for bacteria.  Nasal cytology. A sample of mucus is taken from your nose and examined by your health care  provider to determine if your sinusitis is related to an allergy. TREATMENT Most cases of acute sinusitis are related to a viral infection and will resolve on their own within 10 days. Sometimes, medicines are prescribed to help relieve symptoms of both acute and chronic sinusitis. These may include pain medicines, decongestants, nasal steroid sprays, or saline sprays. However, for sinusitis related to a bacterial infection, your health care provider will prescribe antibiotic medicines. These are medicines that will help kill the bacteria causing the infection. Rarely,  sinusitis is caused by a fungal infection. In these cases, your health care provider will prescribe antifungal medicine. For some cases of chronic sinusitis, surgery is needed. Generally, these are cases in which sinusitis recurs more than 3 times per year, despite other treatments. HOME CARE INSTRUCTIONS  Drink plenty of water. Water helps thin the mucus so your sinuses can drain more easily.  Use a humidifier.  Inhale steam 3-4 times a day (for example, sit in the bathroom with the shower running).  Apply a warm, moist washcloth to your face 3-4 times a day, or as directed by your health care provider.  Use saline nasal sprays to help moisten and clean your sinuses.  Take medicines only as directed by your health care provider.  If you were prescribed either an antibiotic or antifungal medicine, finish it all even if you start to feel better. SEEK IMMEDIATE MEDICAL CARE IF:  You have increasing pain or severe headaches.  You have nausea, vomiting, or drowsiness.  You have swelling around your face.  You have vision problems.  You have a stiff neck.  You have difficulty breathing.   This information is not intended to replace advice given to you by your health care provider. Make sure you discuss any questions you have with your health care provider.   Document Released: 07/04/2005 Document Revised: 07/25/2014 Document Reviewed: 07/19/2011 Elsevier Interactive Patient Education [employer] Elsevier Inc.

## 2016-01-09 NOTE — ED Provider Notes (Signed)
CSN: [id number]     Arrival date & time 01/09/16  1207 History   First MD Initiated Contact with Patient 01/09/16 1304     Chief Complaint  Patient presents with  . Hyperglycemia  . Nasal Congestion     Patient is a 50 y.o. male presenting with hyperglycemia. The history is provided by the patient. No language interpreter was used.  Hyperglycemia  Joshua Petty is a 50 y.o. male who presents to the Emergency Department complaining of nasal congestion, malaise.  Joshua Petty presents for evaluation of multiple complaints but predominantly malaise, generalized weakness, nasal congestion and ear fullness for the last 2 months. He also endorses intermittent blurred vision, decreased smell and taste, excessive thirst and excessive urination. He states he has a yeast infection on his penis that has been present for the last week. He denies any fevers, chest congestion, cough, shortness of breath, chest pain, abdominal pain, vomiting. He has a history of hypertension and diabetes but has been off of all of his meds for the last 2 months because he cannot afford the refills and does not have refills available.  Past Medical History  Diagnosis Date  . Hypertension   . Hyperlipidemia   . Gonorrhea    Past Surgical History  Procedure Laterality Date  . No past surgeries     Family History  Problem Relation Age of Onset  . Diabetes Father   . Diabetes Sister    Social History  Substance Use Topics  . Smoking status: Never Smoker   . Smokeless tobacco: None  . Alcohol Use: No    Review of Systems  All other systems reviewed and are negative.     Allergies  Review of patient's allergies indicates no known allergies.  Home Medications   Prior to Admission medications   Medication Sig Start Date End Date Taking? Authorizing Provider  amLODipine (NORVASC) 10 MG tablet Take 1 tablet (10 mg total) by mouth daily. 01/09/16   Tilden FossaElizabeth Kynli Chou, MD  doxycycline (VIBRAMYCIN) 100 MG capsule Take  1 capsule (100 mg total) by mouth 2 (two) times daily. 01/09/16   Tilden FossaElizabeth Dafney Farler, MD  fenofibrate (TRICOR) 145 MG tablet Take 1 tablet (145 mg total) by mouth daily. Patient not taking: Reported on 01/09/2016 08/12/12   Clydia LlanoMutaz Elmahi, MD  hydrochlorothiazide (HYDRODIURIL) 25 MG tablet Take 1 tablet (25 mg total) by mouth daily. Patient not taking: Reported on 01/09/2016 01/26/15   Elson AreasLeslie K Sofia, PA-C  insulin NPH-insulin regular (NOVOLIN 70/30) (70-30) 100 UNIT/ML injection Inject 45 Units into the skin 2 (two) times daily with a meal. Patient not taking: Reported on 01/09/2016 08/12/12   Clydia LlanoMutaz Elmahi, MD  Insulin Syringe-Needle U-100 25G X 1" 1 ML MISC 1 month supply Patient not taking: Reported on 01/09/2016 08/12/12   Clydia LlanoMutaz Elmahi, MD  lisinopril (PRINIVIL,ZESTRIL) 20 MG tablet Take 1 tablet (20 mg total) by mouth daily. Patient not taking: Reported on 01/09/2016 01/26/15   Elson AreasLeslie K Sofia, PA-C  metFORMIN (GLUCOPHAGE) 500 MG tablet Take 1 tablet (500 mg total) by mouth 2 (two) times daily with a meal. 01/09/16   Tilden FossaElizabeth Kailie Polus, MD  metoprolol (LOPRESSOR) 100 MG tablet Take 1 tablet (100 mg total) by mouth 2 (two) times daily. Patient not taking: Reported on 01/09/2016 01/26/15   Elson AreasLeslie K Sofia, PA-C   BP 154/101 mmHg  Pulse 88  Temp(Src) 97.5 F (36.4 C) (Oral)  Resp 16  SpO2 93% Physical Exam  Constitutional: He is oriented to person, place, and  time. He appears well-developed and well-nourished.  HENT:  Head: Normocephalic and atraumatic.  Mouth/Throat: Oropharynx is clear and moist.  Cerumen impaction bilaterally  Eyes: EOM are normal. Pupils are equal, round, and reactive to light.  Neck: Neck supple.  Cardiovascular: Regular rhythm.   No murmur heard. Tachycardic  Pulmonary/Chest: Effort normal and breath sounds normal. No respiratory distress.  Abdominal: Soft. There is no tenderness. There is no rebound and no guarding.  Musculoskeletal: He exhibits no edema or tenderness.   Neurological: He is alert and oriented to person, place, and time.  5 out of 5 strength in all 4 extremities. No facial asymmetry on examination.  Skin: Skin is warm and dry.  Psychiatric: He has a normal mood and affect. His behavior is normal.  Nursing note and vitals reviewed.   ED Course  Procedures (including critical care time) Labs Review Labs Reviewed  COMPREHENSIVE METABOLIC PANEL - Abnormal; Notable for the following:    Sodium 130 (*)    Chloride 95 (*)    Glucose, Bld 471 (*)    Creatinine, Ser 1.52 (*)    Total Bilirubin 1.5 (*)    GFR calc non Af Amer 52 (*)    All other components within normal limits  URINALYSIS, ROUTINE W REFLEX MICROSCOPIC ([facility]) - Abnormal; Notable for the following:    Specific Gravity, Urine 1.038 (*)    Glucose, UA >1000 (*)    Hgb urine dipstick SMALL (*)    Ketones, ur 40 (*)    Protein, ur >300 (*)    All other components within normal limits  URINE MICROSCOPIC-ADD ON - Abnormal; Notable for the following:    Squamous Epithelial / LPF 0-5 (*)    Bacteria, UA RARE (*)    Casts HYALINE CASTS (*)    All other components within normal limits  CBG MONITORING, ED - Abnormal; Notable for the following:    Glucose-Capillary 475 (*)    All other components within normal limits  CBG MONITORING, ED - Abnormal; Notable for the following:    Glucose-Capillary 398 (*)    All other components within normal limits  CBC WITH DIFFERENTIAL/PLATELET    Imaging Review No results found. I have personally reviewed and evaluated these images and lab results as part of my medical decision-making.   EKG Interpretation None      MDM   Final diagnoses:  Hyperglycemia  Subacute maxillary sinusitis    Patient here for excessive thirst, urination, intermittent blurred vision, nasal congestion. He has a history of hypertension and diabetes but is off his medications. He is hyperglycemic in the emergency department with mild dehydration. He  was provided with 2 L of IV fluids. He did feel improved on recheck the department. Concern for developing sinusitis given her his congestion and symptoms, will treat with antibiotics. Will also restart his antihypertensives. Counseled patient on importance of obtaining a primary care provider. Social work and case management consulted for establishing primary care as well as medication assistance. Home care and return precautions were discussed.    Tilden FossaElizabeth Dnaiel Voller, MD 01/10/16 [phone]

## 2016-01-09 NOTE — ED Notes (Signed)
CBG 398 

## 2016-01-09 NOTE — ED Notes (Signed)
Dr. Madilyn Hookees advised patient is okay to be discharged after IV fluids are finished

## 2016-04-14 ENCOUNTER — Inpatient Hospital Stay (HOSPITAL_COMMUNITY)
Admission: EM | Admit: 2016-04-14 | Discharge: 2016-04-16 | DRG: 727 | Disposition: A | Discharge status: Home / Self Care | Payer: Self-pay | Attending: Oncology | Admitting: Oncology

## 2016-04-14 ENCOUNTER — Observation Stay (HOSPITAL_COMMUNITY): Payer: Self-pay

## 2016-04-14 ENCOUNTER — Encounter (HOSPITAL_COMMUNITY): Payer: Self-pay | Admitting: Emergency Medicine

## 2016-04-14 DIAGNOSIS — Z794 Long term (current) use of insulin: Secondary | ICD-10-CM

## 2016-04-14 DIAGNOSIS — E669 Obesity, unspecified: Secondary | ICD-10-CM | POA: Diagnosis present

## 2016-04-14 DIAGNOSIS — E1110 Type 2 diabetes mellitus with ketoacidosis without coma: Secondary | ICD-10-CM

## 2016-04-14 DIAGNOSIS — E1310 Other specified diabetes mellitus with ketoacidosis without coma: Secondary | ICD-10-CM | POA: Diagnosis present

## 2016-04-14 DIAGNOSIS — Z596 Low income: Secondary | ICD-10-CM

## 2016-04-14 DIAGNOSIS — N454 Abscess of epididymis or testis: Principal | ICD-10-CM | POA: Diagnosis present

## 2016-04-14 DIAGNOSIS — Z91138 Patient's unintentional underdosing of medication regimen for other reason: Secondary | ICD-10-CM

## 2016-04-14 DIAGNOSIS — B954 Other streptococcus as the cause of diseases classified elsewhere: Secondary | ICD-10-CM | POA: Diagnosis present

## 2016-04-14 DIAGNOSIS — N179 Acute kidney failure, unspecified: Secondary | ICD-10-CM | POA: Diagnosis present

## 2016-04-14 DIAGNOSIS — L0291 Cutaneous abscess, unspecified: Secondary | ICD-10-CM

## 2016-04-14 DIAGNOSIS — B3742 Candidal balanitis: Secondary | ICD-10-CM | POA: Diagnosis present

## 2016-04-14 DIAGNOSIS — F1210 Cannabis abuse, uncomplicated: Secondary | ICD-10-CM | POA: Diagnosis present

## 2016-04-14 DIAGNOSIS — Z9889 Other specified postprocedural states: Secondary | ICD-10-CM

## 2016-04-14 DIAGNOSIS — Z8619 Personal history of other infectious and parasitic diseases: Secondary | ICD-10-CM

## 2016-04-14 DIAGNOSIS — I10 Essential (primary) hypertension: Secondary | ICD-10-CM | POA: Diagnosis present

## 2016-04-14 DIAGNOSIS — T383X6A Underdosing of insulin and oral hypoglycemic [antidiabetic] drugs, initial encounter: Secondary | ICD-10-CM | POA: Diagnosis present

## 2016-04-14 DIAGNOSIS — B9689 Other specified bacterial agents as the cause of diseases classified elsewhere: Secondary | ICD-10-CM

## 2016-04-14 DIAGNOSIS — Z9114 Patient's other noncompliance with medication regimen: Secondary | ICD-10-CM

## 2016-04-14 DIAGNOSIS — R739 Hyperglycemia, unspecified: Secondary | ICD-10-CM

## 2016-04-14 DIAGNOSIS — E785 Hyperlipidemia, unspecified: Secondary | ICD-10-CM | POA: Diagnosis present

## 2016-04-14 DIAGNOSIS — E869 Volume depletion, unspecified: Secondary | ICD-10-CM | POA: Diagnosis present

## 2016-04-14 DIAGNOSIS — N492 Inflammatory disorders of scrotum: Secondary | ICD-10-CM | POA: Diagnosis present

## 2016-04-14 DIAGNOSIS — Z79899 Other long term (current) drug therapy: Secondary | ICD-10-CM

## 2016-04-14 DIAGNOSIS — F1220 Cannabis dependence, uncomplicated: Secondary | ICD-10-CM

## 2016-04-14 LAB — BASIC METABOLIC PANEL
Anion gap: 16 — ABNORMAL HIGH (ref 5–15)
Anion gap: 9 (ref 5–15)
Anion gap: 6 (ref 5–15)
BUN: 10 mg/dL (ref 6–20)
BUN: 9 mg/dL (ref 6–20)
BUN: 8 mg/dL (ref 6–20)
CO2: 21 mmol/L — ABNORMAL LOW (ref 22–32)
CO2: 24 mmol/L (ref 22–32)
CO2: 25 mmol/L (ref 22–32)
Calcium: 9.7 mg/dL (ref 8.9–10.3)
Calcium: 8.8 mg/dL — ABNORMAL LOW (ref 8.9–10.3)
Calcium: 8.7 mg/dL — ABNORMAL LOW (ref 8.9–10.3)
Chloride: 94 mmol/L — ABNORMAL LOW (ref 101–111)
Chloride: 101 mmol/L (ref 101–111)
Chloride: 103 mmol/L (ref 101–111)
Creatinine, Ser: 1.31 mg/dL — ABNORMAL HIGH (ref 0.61–1.24)
Creatinine, Ser: 0.94 mg/dL (ref 0.61–1.24)
Creatinine, Ser: 0.83 mg/dL (ref 0.61–1.24)
GFR calc Af Amer: >60 mL/min (ref >60)
GFR calc Af Amer: >60 mL/min (ref >60)
GFR calc Af Amer: >60 mL/min (ref >60)
GFR calc non Af Amer: >60 mL/min (ref >60)
GFR calc non Af Amer: >60 mL/min (ref >60)
GFR calc non Af Amer: >60 mL/min (ref >60)
Glucose, Bld: 533 mg/dL (ref 65–99)
Glucose, Bld: 303 mg/dL — ABNORMAL HIGH (ref 65–99)
Glucose, Bld: 155 mg/dL — ABNORMAL HIGH (ref 65–99)
Potassium: 4.5 mmol/L (ref 3.5–5.1)
Potassium: 3.9 mmol/L (ref 3.5–5.1)
Potassium: 3.6 mmol/L (ref 3.5–5.1)
Sodium: 131 mmol/L — ABNORMAL LOW (ref 135–145)
Sodium: 134 mmol/L — ABNORMAL LOW (ref 135–145)
Sodium: 134 mmol/L — ABNORMAL LOW (ref 135–145)

## 2016-04-14 LAB — CBC WITH DIFFERENTIAL/PLATELET
Basophils Absolute: 0 10*3/uL (ref 0.0–0.1)
Basophils Relative: 0 %
Eosinophils Absolute: 0.1 10*3/uL (ref 0.0–0.7)
Eosinophils Relative: 1 %
HCT: 42.5 % (ref 39.0–52.0)
Hemoglobin: 14.7 g/dL (ref 13.0–17.0)
Lymphocytes Relative: 24 %
Lymphs Abs: 2.1 10*3/uL (ref 0.7–4.0)
MCH: 31 pg (ref 26.0–34.0)
MCHC: 34.6 g/dL (ref 30.0–36.0)
MCV: 89.7 fL (ref 78.0–100.0)
Monocytes Absolute: 0.6 10*3/uL (ref 0.1–1.0)
Monocytes Relative: 7 %
Neutro Abs: 6.2 10*3/uL (ref 1.7–7.7)
Neutrophils Relative %: 68 %
Platelets: 217 10*3/uL (ref 150–400)
RBC: 4.74 MIL/uL (ref 4.22–5.81)
RDW: 12 % (ref 11.5–15.5)
WBC: 9.1 10*3/uL (ref 4.0–10.5)

## 2016-04-14 LAB — LIPID PANEL
Cholesterol: 201 mg/dL — ABNORMAL HIGH (ref 0–200)
HDL: 34 mg/dL — ABNORMAL LOW (ref >40)
LDL Cholesterol: 131 mg/dL — ABNORMAL HIGH (ref 0–99)
Total CHOL/HDL Ratio: 5.9 RATIO
Triglycerides: 178 mg/dL — ABNORMAL HIGH (ref <150)
VLDL: 36 mg/dL (ref 0–40)

## 2016-04-14 LAB — CBG MONITORING, ED
Glucose-Capillary: 527 mg/dL (ref 65–99)
Glucose-Capillary: 424 mg/dL — ABNORMAL HIGH (ref 65–99)
Glucose-Capillary: 415 mg/dL — ABNORMAL HIGH (ref 65–99)
Glucose-Capillary: 307 mg/dL — ABNORMAL HIGH (ref 65–99)
Glucose-Capillary: 284 mg/dL — ABNORMAL HIGH (ref 65–99)

## 2016-04-14 LAB — URINE MICROSCOPIC-ADD ON: RBC / HPF: NONE SEEN RBC/hpf (ref 0–5)

## 2016-04-14 LAB — GLUCOSE, CAPILLARY
Glucose-Capillary: 219 mg/dL — ABNORMAL HIGH (ref 65–99)
Glucose-Capillary: 139 mg/dL — ABNORMAL HIGH (ref 65–99)
Glucose-Capillary: 170 mg/dL — ABNORMAL HIGH (ref 65–99)
Glucose-Capillary: 171 mg/dL — ABNORMAL HIGH (ref 65–99)
Glucose-Capillary: 196 mg/dL — ABNORMAL HIGH (ref 65–99)
Glucose-Capillary: 229 mg/dL — ABNORMAL HIGH (ref 65–99)
Glucose-Capillary: 277 mg/dL — ABNORMAL HIGH (ref 65–99)

## 2016-04-14 LAB — I-STAT CHEM 8, ED
BUN: 14 mg/dL (ref 6–20)
Calcium, Ion: 1.06 mmol/L — ABNORMAL LOW (ref 1.15–1.40)
Chloride: 95 mmol/L — ABNORMAL LOW (ref 101–111)
Creatinine, Ser: 1 mg/dL (ref 0.61–1.24)
Glucose, Bld: 549 mg/dL (ref 65–99)
HCT: 45 % (ref 39.0–52.0)
Hemoglobin: 15.3 g/dL (ref 13.0–17.0)
Potassium: 4.5 mmol/L (ref 3.5–5.1)
Sodium: 131 mmol/L — ABNORMAL LOW (ref 135–145)
TCO2: 21 mmol/L (ref 0–100)

## 2016-04-14 LAB — URINALYSIS, ROUTINE W REFLEX MICROSCOPIC
Bilirubin Urine: NEGATIVE
Glucose, UA: >1000 mg/dL — AB
Ketones, ur: 40 mg/dL — AB
Leukocytes, UA: NEGATIVE
Nitrite: NEGATIVE
Protein, ur: 100 mg/dL — AB
Specific Gravity, Urine: 1.046 — ABNORMAL HIGH (ref 1.005–1.030)
pH: 5.5 (ref 5.0–8.0)

## 2016-04-14 LAB — MRSA PCR SCREENING: MRSA by PCR: NEGATIVE

## 2016-04-14 LAB — I-STAT CG4 LACTIC ACID, ED: Lactic Acid, Venous: 1.72 mmol/L (ref 0.5–1.9)

## 2016-04-14 MED ORDER — DEXTROSE-NACL 5-0.45 % IV SOLN
INTRAVENOUS | Status: AC
  Administered 2016-04-14: 18:00:00 via INTRAVENOUS

## 2016-04-14 MED ORDER — DEXTROSE-NACL 5-0.45 % IV SOLN: INTRAVENOUS | Status: DC

## 2016-04-14 MED ORDER — SODIUM CHLORIDE 0.9 % IV BOLUS (SEPSIS)
1000.0000 mL | Freq: Once | INTRAVENOUS | Status: AC
  Administered 2016-04-14: 1000 mL via INTRAVENOUS

## 2016-04-14 MED ORDER — CLINDAMYCIN PHOSPHATE 600 MG/50ML IV SOLN
600.0000 mg | Freq: Once | INTRAVENOUS | Status: AC
  Administered 2016-04-14: 600 mg via INTRAVENOUS
  Filled 2016-04-14: qty 50

## 2016-04-14 MED ORDER — INSULIN ASPART 100 UNIT/ML ~~LOC~~ SOLN
0.0000 [IU] | Freq: Three times a day (TID) | SUBCUTANEOUS | Status: DC
  Administered 2016-04-15 (×2): 11 [IU] via SUBCUTANEOUS
  Administered 2016-04-15: 15 [IU] via SUBCUTANEOUS
  Administered 2016-04-16: 5 [IU] via SUBCUTANEOUS
  Administered 2016-04-16: 15 [IU] via SUBCUTANEOUS

## 2016-04-14 MED ORDER — ONDANSETRON HCL 4 MG/2ML IJ SOLN: 4.0000 mg | Freq: Four times a day (QID) | INTRAMUSCULAR | Status: DC | PRN

## 2016-04-14 MED ORDER — SODIUM CHLORIDE 0.9 % IV SOLN: INTRAVENOUS | Status: DC

## 2016-04-14 MED ORDER — MORPHINE SULFATE (PF) 4 MG/ML IV SOLN
4.0000 mg | Freq: Once | INTRAVENOUS | Status: AC
  Administered 2016-04-14: 4 mg via INTRAVENOUS
  Filled 2016-04-14: qty 1

## 2016-04-14 MED ORDER — ONDANSETRON HCL 4 MG PO TABS: 4.0000 mg | ORAL_TABLET | Freq: Four times a day (QID) | ORAL | Status: DC | PRN

## 2016-04-14 MED ORDER — INFLUENZA VAC SPLIT QUAD 0.5 ML IM SUSY
0.5000 mL | PREFILLED_SYRINGE | INTRAMUSCULAR | Status: DC
  Filled 2016-04-14 (×2): qty 0.5

## 2016-04-14 MED ORDER — MORPHINE SULFATE (PF) 2 MG/ML IV SOLN
1.0000 mg | INTRAVENOUS | Status: DC | PRN
Start: 2016-04-14 — End: 2016-04-16
  Administered 2016-04-14 – 2016-04-15 (×4): 1 mg via INTRAVENOUS
  Filled 2016-04-14 (×4): qty 1

## 2016-04-14 MED ORDER — INSULIN GLARGINE 100 UNIT/ML ~~LOC~~ SOLN
35.0000 [IU] | Freq: Once | SUBCUTANEOUS | Status: AC
  Administered 2016-04-14: 35 [IU] via SUBCUTANEOUS
  Filled 2016-04-14 (×2): qty 0.35

## 2016-04-14 MED ORDER — INSULIN REGULAR HUMAN 100 UNIT/ML IJ SOLN
INTRAMUSCULAR | Status: AC
Start: 2016-04-14 — End: 2016-04-14
  Administered 2016-04-14: 3.6 [IU]/h via INTRAVENOUS
  Administered 2016-04-14: 6.6 [IU]/h via INTRAVENOUS
  Filled 2016-04-14: qty 2.5

## 2016-04-14 MED ORDER — POTASSIUM CHLORIDE 10 MEQ/100ML IV SOLN
10.0000 meq | INTRAVENOUS | Status: AC
  Administered 2016-04-14 (×2): 10 meq via INTRAVENOUS
  Filled 2016-04-14 (×2): qty 100

## 2016-04-14 MED ORDER — ENOXAPARIN SODIUM 40 MG/0.4ML ~~LOC~~ SOLN
40.0000 mg | SUBCUTANEOUS | Status: DC
  Administered 2016-04-14 – 2016-04-15 (×2): 40 mg via SUBCUTANEOUS
  Filled 2016-04-14 (×2): qty 0.4

## 2016-04-14 MED ORDER — CLINDAMYCIN PHOSPHATE 600 MG/50ML IV SOLN
600.0000 mg | Freq: Three times a day (TID) | INTRAVENOUS | Status: DC
  Administered 2016-04-14 – 2016-04-16 (×5): 600 mg via INTRAVENOUS
  Filled 2016-04-14 (×5): qty 50

## 2016-04-14 MED ORDER — SODIUM CHLORIDE 0.9 % IV SOLN
INTRAVENOUS | Status: DC
  Administered 2016-04-14: 11:00:00 via INTRAVENOUS

## 2016-04-14 MED ORDER — ENOXAPARIN SODIUM 40 MG/0.4ML ~~LOC~~ SOLN
40.0000 mg | SUBCUTANEOUS | Status: DC
Start: 2016-04-14 — End: 2016-04-14

## 2016-04-14 MED ORDER — OXYCODONE-ACETAMINOPHEN 5-325 MG PO TABS
1.0000 | ORAL_TABLET | ORAL | Status: DC | PRN
Start: 2016-04-14 — End: 2016-04-16
  Administered 2016-04-15 – 2016-04-16 (×3): 1 via ORAL
  Filled 2016-04-14 (×4): qty 1

## 2016-04-14 MED ORDER — POTASSIUM CHLORIDE CRYS ER 20 MEQ PO TBCR
40.0000 meq | EXTENDED_RELEASE_TABLET | Freq: Once | ORAL | Status: AC
  Administered 2016-04-14: 40 meq via ORAL
  Filled 2016-04-14: qty 2

## 2016-04-14 MED ORDER — ATORVASTATIN CALCIUM 40 MG PO TABS
40.0000 mg | ORAL_TABLET | Freq: Every day | ORAL | Status: DC
  Administered 2016-04-15: 40 mg via ORAL
  Filled 2016-04-14: qty 1

## 2016-04-14 MED ORDER — PNEUMOCOCCAL VAC POLYVALENT 25 MCG/0.5ML IJ INJ
0.5000 mL | INJECTION | INTRAMUSCULAR | Status: DC
  Filled 2016-04-14: qty 0.5

## 2016-04-14 MED ORDER — LIDOCAINE HCL (PF) 1 % IJ SOLN
INTRAMUSCULAR | Status: AC
  Administered 2016-04-14: 30 mL
  Filled 2016-04-14: qty 30

## 2016-04-14 NOTE — ED Provider Notes (Signed)
[facility]-EMERGENCY DEPT Provider Note   CSN: [id number] Arrival date & time: 04/14/16  0827  By signing my name below, I, Freida Busman, attest that this documentation has been prepared under the direction and in the presence of non-physician practitioner, Wynetta Emery, PA-C. Electronically Signed: Freida Busman, Scribe. 04/14/2016. 9:31 AM.   History   Chief Complaint Chief Complaint  Patient presents with  . Abscess     The history is provided by the patient. No language interpreter was used.    HPI Comments:  Joshua Petty is a 50 y.o. male with a history of NIDDM, who presents to the Emergency Department complaining of painful swelling to right testicle which he first noticed 3 weeks ago. He noted the site opened up ~ 1 week ago and drainage pus and blood but has since gotten larger. Pt denies fever, chills, nausea, vomiting, pain with defication, pain outside testicle area, and dysuria. Pt has also been out of his DM meds x 1 month.   Past Medical History:  Diagnosis Date  . Gonorrhea   . Hyperlipidemia   . Hypertension     Patient Active Problem List   Diagnosis Date Noted  . Hyperglycemia 04/14/2016  . Uncontrolled type 2 diabetes mellitus with hyperosmolar nonketotic hyperglycemia (HCC) 08/09/2012  . Dehydration with hyponatremia 08/09/2012  . Acute renal failure (HCC) 08/09/2012  . Hyperkalemia 08/09/2012  . Marijuana abuse 08/09/2012  . Obesity 08/09/2012    Past Surgical History:  Procedure Laterality Date  . NO PAST SURGERIES         Home Medications    Prior to Admission medications   Medication Sig Start Date End Date Taking? Authorizing Provider  amLODipine (NORVASC) 10 MG tablet Take 1 tablet (10 mg total) by mouth daily. 01/09/16   Tilden Fossa, MD  doxycycline (VIBRAMYCIN) 100 MG capsule Take 1 capsule (100 mg total) by mouth 2 (two) times daily. 01/09/16   Tilden Fossa, MD  fenofibrate (TRICOR) 145 MG tablet Take 1 tablet (145 mg total)  by mouth daily. Patient not taking: Reported on 01/09/2016 08/12/12   Clydia Llano, MD  hydrochlorothiazide (HYDRODIURIL) 25 MG tablet Take 1 tablet (25 mg total) by mouth daily. Patient not taking: Reported on 01/09/2016 01/26/15   Elson Areas, PA-C  insulin NPH-insulin regular (NOVOLIN 70/30) (70-30) 100 UNIT/ML injection Inject 45 Units into the skin 2 (two) times daily with a meal. Patient not taking: Reported on 01/09/2016 08/12/12   Clydia Llano, MD  Insulin Syringe-Needle U-100 25G X 1" 1 ML MISC 1 month supply Patient not taking: Reported on 01/09/2016 08/12/12   Clydia Llano, MD  lisinopril (PRINIVIL,ZESTRIL) 20 MG tablet Take 1 tablet (20 mg total) by mouth daily. Patient not taking: Reported on 01/09/2016 01/26/15   Elson Areas, PA-C  metFORMIN (GLUCOPHAGE) 500 MG tablet Take 1 tablet (500 mg total) by mouth 2 (two) times daily with a meal. 01/09/16   Tilden Fossa, MD  metoprolol (LOPRESSOR) 100 MG tablet Take 1 tablet (100 mg total) by mouth 2 (two) times daily. Patient not taking: Reported on 01/09/2016 01/26/15   Elson Areas, PA-C    Family History Family History  Problem Relation Age of Onset  . Diabetes Father   . Diabetes Sister     Social History Social History  Substance Use Topics  . Smoking status: Never Smoker  . Smokeless tobacco: Never Used  . Alcohol use No     Allergies   Review of patient's allergies indicates no known allergies.  Review of Systems Review of Systems 10 systems reviewed and all are negative for acute change except as noted in the HPI.  Physical Exam Updated Vital Signs BP 133/91 (BP Location: Right Arm)   Pulse 107   Temp 97.5 F (36.4 C) (Oral)   Resp 14   SpO2 97%   Physical Exam  Constitutional: He is oriented to person, place, and time. He appears well-developed and well-nourished. No distress.  HENT:  Head: Normocephalic and atraumatic.  Eyes: Conjunctivae are normal.  Cardiovascular: Regular rhythm.  Exam reveals no  gallop and no friction rub.   No murmur heard. Mild tachycardia   Pulmonary/Chest: Effort normal. No respiratory distress. He has no wheezes. He has no rales. He exhibits no tenderness.  Abdominal: Soft. Bowel sounds are normal. He exhibits no distension and no mass. There is no tenderness. There is no rebound and no guarding. No hernia.  Genitourinary:  Genitourinary Comments: Uncircumcised penis Tense tender right testicle with 2 areas of fluctuance on the lateral aspect   No perineal crepitance or tenderness.  Neurological: He is alert and oriented to person, place, and time.  Skin: Skin is warm and dry. Capillary refill takes less than 2 seconds.  Psychiatric: He has a normal mood and affect.  Nursing note and vitals reviewed.    ED Treatments / Results  DIAGNOSTIC STUDIES:  Oxygen Saturation is 97% on RA, normal by my interpretation.    COORDINATION OF CARE:  9:00 AM Discussed treatment plan with pt at bedside and pt agreed to plan.  Labs (all labs ordered are listed, but only abnormal results are displayed) Labs Reviewed  URINALYSIS, ROUTINE W REFLEX MICROSCOPIC (NOT AT [facility]) - Abnormal; Notable for the following:       Result Value   Specific Gravity, Urine 1.046 (*)    Glucose, UA >1000 (*)    Hgb urine dipstick TRACE (*)    Ketones, ur 40 (*)    Protein, ur 100 (*)    All other components within normal limits  BASIC METABOLIC PANEL - Abnormal; Notable for the following:    Sodium 131 (*)    Chloride 94 (*)    CO2 21 (*)    Glucose, Bld 533 (*)    Creatinine, Ser 1.31 (*)    Anion gap 16 (*)    All other components within normal limits  URINE MICROSCOPIC-ADD ON - Abnormal; Notable for the following:    Squamous Epithelial / LPF 6-30 (*)    Bacteria, UA FEW (*)    All other components within normal limits  I-STAT CHEM 8, ED - Abnormal; Notable for the following:    Sodium 131 (*)    Chloride 95 (*)    Glucose, Bld 549 (*)    Calcium, Ion 1.06 (*)    All  other components within normal limits  CBG MONITORING, ED - Abnormal; Notable for the following:    Glucose-Capillary 527 (*)    All other components within normal limits  CULTURE, BLOOD (ROUTINE X 2)  CULTURE, BLOOD (ROUTINE X 2)  CBC WITH DIFFERENTIAL/PLATELET  BASIC METABOLIC PANEL  BASIC METABOLIC PANEL  BASIC METABOLIC PANEL  I-STAT CG4 LACTIC ACID, ED  I-STAT CG4 LACTIC ACID, ED    EKG  EKG Interpretation None       Radiology No results found.  Procedures .Marland KitchenIncision and Drainage Date/Time: 04/14/2016 10:16 AM Performed by: Wynetta Emery Authorized by: Wynetta Emery   Consent:    Consent obtained:  Verbal  Consent given by:  Patient   Risks discussed:  Bleeding   Alternatives discussed:  No treatment Pre-procedure details:    Skin preparation:  Chloraprep Procedure type:    Complexity:  Complex Procedure details:    Needle aspiration: no     Incision types:  Single straight   Scalpel blade:  11   Wound management:  Probed and deloculated, irrigated with saline and extensive cleaning   Drainage:  Bloody and purulent   Drainage amount:  Copious   Wound treatment:  Wound left open   Packing materials:  None Post-procedure details:    Patient tolerance of procedure:  Tolerated well, no immediate complications    (including critical care time)      Medications Ordered in ED Medications  clindamycin (CLEOCIN) IVPB 600 mg (600 mg Intravenous New Bag/Given 04/14/16 1013)  insulin regular (NOVOLIN R,HUMULIN R) 250 Units in sodium chloride 0.9 % 250 mL (1 Units/mL) infusion (not administered)  sodium chloride 0.9 % bolus 1,000 mL (not administered)  0.9 %  sodium chloride infusion (not administered)  morphine 4 MG/ML injection 4 mg (not administered)  sodium chloride 0.9 % bolus 1,000 mL (0 mLs Intravenous Stopped 04/14/16 1000)  lidocaine (PF) (XYLOCAINE) 1 % injection (30 mLs  Given 04/14/16 0900)     Initial Impression / Assessment and Plan / ED  Course  I have reviewed the triage vital signs and the nursing notes.  Pertinent labs & imaging results that were available during my care of the patient were reviewed by me and considered in my medical decision making (see chart for details).  Clinical Course    Vitals:   04/14/16 0840  BP: 133/91  Pulse: 107  Resp: 14  Temp: 97.5 F (36.4 C)  TempSrc: Oral  SpO2: 97%    Medications  clindamycin (CLEOCIN) IVPB 600 mg (600 mg Intravenous New Bag/Given 04/14/16 1013)  insulin regular (NOVOLIN R,HUMULIN R) 250 Units in sodium chloride 0.9 % 250 mL (1 Units/mL) infusion (not administered)  sodium chloride 0.9 % bolus 1,000 mL (not administered)  0.9 %  sodium chloride infusion (not administered)  morphine 4 MG/ML injection 4 mg (not administered)  sodium chloride 0.9 % bolus 1,000 mL (0 mLs Intravenous Stopped 04/14/16 1000)  lidocaine (PF) (XYLOCAINE) 1 % injection (30 mLs  Given 04/14/16 0900)    Meredeth Idelexander Meinzer is 50 y.o. male presenting with Large abscess to right testicle. No crepitance or tenderness along the per any a.m., I doubt this is a Fournier's gangrene. He has been without his diabetic medications for 1 month. Symptoms started 3 weeks ago. IMP is performed with a copious amount of blood and purulent discharge. Patient's blood sugar is significantly elevated at over 500, he has a mild anion gap of 16 with 40 ketones in the urine. Glucose stabilizer initiated, he is mildly tachycardic but overall nontoxic appearing. Blood cultures are drawn and patient will be started on clindamycin. Unassigned admission to internal medicine teaching service. d/w Dr. Beckie Saltsivet, discussed with resident.        Final Clinical Impressions(s) / ED Diagnoses   Final diagnoses:  Abscess  Patient cannot afford medications  Hyperglycemia    New Prescriptions New Prescriptions   No medications on file   I personally performed the services described in this documentation, which was scribed in  my presence.  The recorded information has been reviewed and is accurate.    Wynetta Emeryicole Tiffanye Hartmann, PA-C 04/14/16 [address]22    Hondo Nanda, PA-C 04/14/16 1113  Geoffery Lyons, MD 04/15/16 [phone]

## 2016-04-14 NOTE — H&P (Signed)
Date: 04/14/2016               Patient Name:  Joshua Petty MRN: [id number]  DOB: 1966/06/16 Age / Sex: 50 y.o., male   PCP: No Pcp Per Patient         Medical Service: Internal Medicine Teaching Service         Attending Physician: Dr. Levert Feinstein, MD    First Contact: Dr. Mikey Bussing Pager: [phone]  Second Contact: Dr. Lawerance Bach  Pager: [phone]       After Hours (After 5p/  First Contact Pager: [phone]  weekends / holidays): Second Contact Pager: [phone]   Chief Complaint: Scrotal pain and swelling   History of Present Illness: Joshua Petty is a 50yo man with PMHx of uncontrolled type 2 DM, HTN, and hyperlipidemia who presents today with a 3 week hx of worsening scrotal pain and swelling. He states when his symptoms initially started he did not have much pain but noticed discharge from an area of his right testicle. Discharge was clear at first but then became "milky and burgundy" in color. He then notes about 1.5 weeks ago he noticed increased pain and swelling of his scrotum. He had increased discharge again from the right testis about 3 days ago which decreased the swelling and pain, but then again his scrotum became more swollen and he had worsening pain. He tried taking 2 Motrin but this provided little relief. He also describes having a "cottage cheese-like substance" on his penis. He denies any penile discharge. He also notes dysuria because his penis is raw. He denies any hematuria. He reports having gonorrhea in the past and his dysuria now is different than when he had that STD. He denies any fevers, chills, abdominal pain, flank pain, nausea, or vomiting.   In the ED, two areas of fluctuance were noted on the lateral aspect of the right testicle and he underwent I&D which expressed approximately 400cc of bloody pus. He reports relief of his pain since this procedure. His glucose was also noted to be elevated in the 500s with increased AG and ketones in his urine. Patient  reports he has blurry vision and numbness in his feet which is not new. He states he is only on Metformin currently, but used to be on insulin a few years ago. He ran out of his Metformin about 1 month ago as he received his last prescription from the ED.  He also used to be on hypertension and hyperlipidemia medications but these were also stopped. He does not have a PCP.   Meds:  Metformin 500 mg BID  Allergies: Allergies as of 04/14/2016  . (No Known Allergies)   Past Medical History:  Diagnosis Date  . Gonorrhea   . Hyperlipidemia   . Hypertension     Family History: Father- DM. Sister- DM.  Social History: Lives at home with his sister. Never smoker. Denies alcohol use. Admits to weekly marijuana use. Denies IV drug use.   Review of Systems: A complete ROS was negative except as per HPI.   Physical Exam: Blood pressure 133/91, pulse 107, temperature 97.5 F (36.4 C), temperature source Oral, resp. rate 14, SpO2 97 %.  General: obese man sitting up in bed, NAD, pleasant HEENT: [place]/AT, EOMI, sclera anicteric, mucus membranes dry CV: RRR, no m/g/r Pulm: CTA bilaterally, breaths non-labored Abd: BS+, soft obese, non-tender GU: There is mild swelling, erythema, and tenderness to palpation of the right testicle. There is no erythema  or swelling extending into the perineum.  Ext: warm, no peripheral edema  Neuro: alert and oriented x 3. No focal deficits.   Labs: WBC 9.1 Na 131 K 4.5 Bicarb 21 Glucose 533 Cr 1.31 AG 16 UA- 40 ketones, glucose >1000, protein 100, WBC 6-30, yeast present  Blood cx pending   Assessment & Plan by Problem:  Scrotal Abscess: Patient presented with a 3 week hx of right-sided scrotal pain, swelling, and discharge found to have a large abscess in his right testicle. I&D was performed in the ED and expressed ~ 400cc of bloody pus. He was started on Clindamycin. His pain has improved significantly with I&D. He likely developed an abscess related to  his uncontrolled DM and obesity. He is also describing what sounds like a yeast infection of his penis. I did not see any "cottage cheese" like substance present, but his urine does have yeast present. He also has a history of prior gonorrhea infection so will test him for STDs.  - Send scrotal pus for culture - Continue Clindamycin - Wound care - HIV, RPR, GC/chlamydia - Morphine 1 mg Q4H PRN severe pain - Percocet 5-325 mg Q4H PRN mild/moderate pain  DKA in Setting of Poorly Controlled Type 2 DM: Patient presented with glucose in the 500s found to have ketones in his urine and AG of 16 consistent with DKA in the setting of acute infection. He was started on insulin gtt in the ED. I switched him over to NS from D5 as his blood sugar is still significantly elevated. Will continue him on NS until blood glucose drops below 250. His last A1c in the system is 14.2 in 2014. He does not have a PCP currently and is only on Metformin. He will likely need to be on insulin therapy.  - Admit to step down - Continue insulin gtt - Give NS bolus x 1 (received 1 already in ED) followed by NS at 200 ml/hr - Switch to D5 1/2 NS when blood glucose 250 or less - bmets Q4H - Can give Lantus when gap closed x 2 - Gave KCl 10 mEq x 2, monitor K closely and replete as necessary - NPO, can take sips of water - Check HbA1c - CBGs Q1 hour  AKI: Cr 1.31 on admission. Baseline appears to be 1.0. Likely prerenal from volume depletion from hyperglycemia.  - Check bmet in AM  HTN: BPs in 130s-160s on admission. Patient reports he was on antihypertensives a few years ago but that these medications were stopped by his old PCP. Likely somewhat hypertensive secondary to pain.  - Hold off on antihypertensives in acute setting - Will need outpatient follow up  Hyperlipidemia: Last lipid profile in 2014 showed Chol 247, Trigly 1,025, HDL 27, and LDL unable to be calculated due to high triglycerides. Appears he used to be on  Fenofibrate but not taking currently.  - Recheck lipid panel - Start atorvastatin 40 mg daily given his ASCVD risk score of 17.8%   Diet: NPO, Carb modified once gap closes  DVT Ppx: Lovenox SQ Dispo: Admit patient to Observation with expected length of stay less than 2 midnights.  Signed: Su Hoffarly J Dabid Godown, MD 04/14/2016, 10:45 AM  Pager: [phone]

## 2016-04-14 NOTE — Progress Notes (Signed)
MD Mikey BussingHoffman notified that patient CBG had been below 250 the last two checks (215, and 139). New orders received will continue to monitor patient.

## 2016-04-14 NOTE — ED Notes (Signed)
PA at bedside for I & D of scrotum.

## 2016-04-14 NOTE — ED Notes (Signed)
Pt moved to POD E-46 in the care of [place], [place].

## 2016-04-14 NOTE — Discharge Planning (Signed)
EDCM consulted to assist with PCP establishment of uninsured pt.  [facility] Monday at 9:00 AM as they will be taking new pts at that time.  Information will be provided on AVS.

## 2016-04-14 NOTE — Progress Notes (Signed)
Spoke to intern on call, MD does not want maintenance fluid as pt has good PO intake and MD placed orders for AC/HS insulin coverage, does not want Q4H insulin at this time.

## 2016-04-14 NOTE — Consult Note (Addendum)
WOC Nurse wound consult note Reason for Consult: Consult requested for previous scrotal abscess.  Pt had an I&D performed this afternoon in the ER and the site had 400cc bloody pus drainage, according to the EMR notes. Wound type: Full thickness Dressing procedure/placement/frequency: Dressing was just applied after the procedure.  If patient is admitted, WOC will plan to change packing tomorrow.  Pt could benefit from home health for dressing change assistance after discharge, please order if desired.  Moist gauze packing Q day with ABD pad to absorb drainage and mesh underwear to hold in place and eliminate use of tape. Cammie Mcgeeawn Nicci Vaughan MSN, RN, CWOCN, [place], CNS [phone]

## 2016-04-14 NOTE — ED Triage Notes (Signed)
Pt to ER by private vehicle for evaluation of abscess to right groin and scrotal area onset 1 week ago. Pt denies fever but severe tenderness. A/o x4.

## 2016-04-15 LAB — GLUCOSE, CAPILLARY
Glucose-Capillary: 317 mg/dL — ABNORMAL HIGH (ref 65–99)
Glucose-Capillary: 331 mg/dL — ABNORMAL HIGH (ref 65–99)
Glucose-Capillary: 382 mg/dL — ABNORMAL HIGH (ref 65–99)
Glucose-Capillary: 316 mg/dL — ABNORMAL HIGH (ref 65–99)

## 2016-04-15 LAB — BASIC METABOLIC PANEL
Anion gap: 8 (ref 5–15)
BUN: 9 mg/dL (ref 6–20)
CO2: 25 mmol/L (ref 22–32)
Calcium: 8.8 mg/dL — ABNORMAL LOW (ref 8.9–10.3)
Chloride: 100 mmol/L — ABNORMAL LOW (ref 101–111)
Creatinine, Ser: 0.95 mg/dL (ref 0.61–1.24)
GFR calc Af Amer: >60 mL/min (ref >60)
GFR calc non Af Amer: >60 mL/min (ref >60)
Glucose, Bld: 328 mg/dL — ABNORMAL HIGH (ref 65–99)
Potassium: 4.4 mmol/L (ref 3.5–5.1)
Sodium: 133 mmol/L — ABNORMAL LOW (ref 135–145)

## 2016-04-15 LAB — CBC
HCT: 37.5 % — ABNORMAL LOW (ref 39.0–52.0)
Hemoglobin: 12.6 g/dL — ABNORMAL LOW (ref 13.0–17.0)
MCH: 30.5 pg (ref 26.0–34.0)
MCHC: 33.6 g/dL (ref 30.0–36.0)
MCV: 90.8 fL (ref 78.0–100.0)
Platelets: 199 10*3/uL (ref 150–400)
RBC: 4.13 MIL/uL — ABNORMAL LOW (ref 4.22–5.81)
RDW: 12.2 % (ref 11.5–15.5)
WBC: 7.8 10*3/uL (ref 4.0–10.5)

## 2016-04-15 LAB — RPR: RPR Ser Ql: NONREACTIVE

## 2016-04-15 LAB — AEROBIC CULTURE W GRAM STAIN (SUPERFICIAL SPECIMEN)

## 2016-04-15 LAB — HIV ANTIBODY (ROUTINE TESTING W REFLEX): HIV Screen 4th Generation wRfx: NONREACTIVE

## 2016-04-15 LAB — GC/CHLAMYDIA PROBE AMP (~~LOC~~) NOT AT ARMC
Chlamydia: NEGATIVE
Neisseria Gonorrhea: NEGATIVE

## 2016-04-15 LAB — HEMOGLOBIN A1C
Hgb A1c MFr Bld: >15.5 % — ABNORMAL HIGH (ref 4.8–5.6)
Mean Plasma Glucose: >398 mg/dL

## 2016-04-15 LAB — AEROBIC CULTURE  (SUPERFICIAL SPECIMEN)

## 2016-04-15 MED ORDER — INSULIN GLARGINE 100 UNIT/ML ~~LOC~~ SOLN
40.0000 [IU] | Freq: Every day | SUBCUTANEOUS | Status: DC
  Administered 2016-04-15: 40 [IU] via SUBCUTANEOUS
  Filled 2016-04-15: qty 0.4

## 2016-04-15 NOTE — Progress Notes (Signed)
   Subjective: Patient was evaluated this morning on rounds. He was laying comfortably in bed. He still had some tenderness in his scrotal area. He was tolerating solids and liquids.  Objective:  Vital signs in last 24 hours: Vitals:   04/15/16 0009 04/15/16 0400 04/15/16 0600 04/15/16 0804  BP: (!) 124/102 (!) 128/95 133/90 (!) 121/99  Pulse: 79 78 83 91  Resp: 18 15 12 12   Temp: 98.5 F (36.9 C) 97.5 F (36.4 C)  98.1 F (36.7 C)  TempSrc: Oral Oral  Oral  SpO2: 95% 95% 91% 96%  Weight:      Height:       Physical Exam  Constitutional: He is well-developed, well-nourished, and in no distress.  Cardiovascular: Normal rate, regular rhythm and normal heart sounds.  Exam reveals no gallop and no friction rub.   No murmur heard. Pulmonary/Chest: Effort normal and breath sounds normal. No respiratory distress. He has no wheezes. He has no rales.  Abdominal: Soft. Bowel sounds are normal. He exhibits no distension. There is no tenderness.  Genitourinary:  Genitourinary Comments: Right scrotum I&D incision site packaged with guaze  Musculoskeletal: He exhibits no edema.  Neurological: He is alert.  Skin: Skin is warm and dry.     Assessment/Plan:  Principal Problem:   Scrotal abscess Active Problems:   Acute renal failure (HCC)   Marijuana abuse   DKA (diabetic ketoacidoses) (HCC)  Scrotal abscess Wound care packed the incision site with gauze. Patient was educated on changing dressing and cleaning area. He has been treated with clindamycin.   HIV and RPR nonreactive - gc/Chlamydia pending - Urology consulted - clindamycin  DKA in setting of poorly controlled type 2 diabetes mellitus No anion gap.  Patient has received diabetic counseling in the hospital. He was noncompliant on his metformin prior to hospitalization. He may need to be on insulin post discharge -SSI -Lantus  AKI -resolved  Hypertension Blood pressure this morning was 121/99 -continuing to hold BP  medications  Hyperlipidemia Patient denies shortness of breath or chest pain. -Atorvastatin 40 mg daily  Dispo: Anticipated discharge in approximately 1 day(s).   Camelia PhenesJessica Ratliff Tiya Schrupp, DO 04/15/2016, 4:06 PM Pager: [phone]

## 2016-04-15 NOTE — Consult Note (Addendum)
WOC consult requested for scrotal abscess, which had I&D performed yesterday in the ER, refer to previous WOC notes. Right inner groin/scrotum with full thickness incision; 1X.2X5cm when swab inserted.  Pt medicated for pain prior to procedure and he tolerated with minimal discomfort; states pain is greatly decreased since site was drained yesterday.  Packed with gauze strip to facilitate drainage. Demonstrated technique for patient to perform dressing changes after discharge; he feels that he or a family member will be able to perform. Plan: Bedside nurse can change the scrotum dressing Q day as follows: insert a strip of iodoform packing, using swab to fill.  Cover outer wound with ABD pad and use mesh underwear to hold in place and avoid use of tape. Please re-consult if further assistance is needed.  Thank-you,  Cammie Mcgeeawn Shawnie Nicole MSN, RN, CWOCN, [place], CNS [phone]

## 2016-04-15 NOTE — Consult Note (Signed)
Urology Consult  Referring physician: D rC  [place] Reason for referral: scrotal abcess  Chief Complaint: Scrotal abscess  History of Present Illness: Admitted yesterday with scrotal pain; drained last week spontaneously but got larger; no fever; diabetic; I and D last night; not compliant with insulin and meds;   Pain x 3 weeks; moderate frequency and no fever; no dysuria; pain much better since I and D  Modifying factors: There are no other modifying factors  Associated signs and symptoms: There are no other associated signs and symptoms Aggravating and relieving factors: There are no other aggravating or relieving factors Severity: Moderate Duration: Persistent  Past Medical History:  Diagnosis Date  . Gonorrhea   . Hyperlipidemia   . Hypertension    Past Surgical History:  Procedure Laterality Date  . NO PAST SURGERIES      Medications: I have reviewed the patient's current medications. Allergies: No Known Allergies  Family History  Problem Relation Age of Onset  . Diabetes Father   . Diabetes Sister    Social History:  reports that he has never smoked. He has never used smokeless tobacco. He reports that he uses drugs, including Marijuana. He reports that he does not drink alcohol.  ROS: All systems are reviewed and negative except as noted. Rest negative  Physical Exam:  Vital signs in last 24 hours: Temp:  [97.5 F (36.4 C)-98.5 F (36.9 C)] 98.1 F (36.7 C) (09/29 1624) Pulse Rate:  [78-101] 101 (09/29 1624) Resp:  [12-19] 19 (09/29 1624) BP: (121-145)/(86-102) 145/97 (09/29 1624) SpO2:  [91 %-96 %] 96 % (09/29 1624)  Cardiovascular: Skin warm; not flushed Respiratory: Breaths quiet; no shortness of breath Abdomen: No masses Neurological: Normal sensation to touch Musculoskeletal: Normal motor function arms and legs Lymphatics: No inguinal adenopathy Skin: No rashes Genitourinary:right scrotal mildly indurated and draining VERY well thru  two incisions; no cellulitis today  Laboratory Data:  Results for orders placed or performed during the hospital encounter of 04/14/16 (from the past 72 hour(s))  CBC with Differential     Status: None   Collection Time: 04/14/16  8:41 AM  Result Value Ref Range   WBC 9.1 4.0 - 10.5 K/uL   RBC 4.74 4.22 - 5.81 MIL/uL   Hemoglobin 14.7 13.0 - 17.0 g/dL   HCT 42.5 39.0 - 52.0 %   MCV 89.7 78.0 - 100.0 fL   MCH 31.0 26.0 - 34.0 pg   MCHC 34.6 30.0 - 36.0 g/dL   RDW 12.0 11.5 - 15.5 %   Platelets 217 150 - 400 K/uL   Neutrophils Relative % 68 %   Neutro Abs 6.2 1.7 - 7.7 K/uL   Lymphocytes Relative 24 %   Lymphs Abs 2.1 0.7 - 4.0 K/uL   Monocytes Relative 7 %   Monocytes Absolute 0.6 0.1 - 1.0 K/uL   Eosinophils Relative 1 %   Eosinophils Absolute 0.1 0.0 - 0.7 K/uL   Basophils Relative 0 %   Basophils Absolute 0.0 0.0 - 0.1 K/uL  CBG monitoring, ED     Status: Abnormal   Collection Time: 04/14/16  8:51 AM  Result Value Ref Range   Glucose-Capillary 527 (HH) 65 - 99 mg/dL  I-Stat Chem 8, ED     Status: Abnormal   Collection Time: 04/14/16  8:59 AM  Result Value Ref Range   Sodium 131 (L) 135 - 145 mmol/L   Potassium 4.5 3.5 - 5.1 mmol/L   Chloride 95 (L) 101 - 111 mmol/L  BUN 14 6 - 20 mg/dL   Creatinine, Ser 1.00 0.61 - 1.24 mg/dL   Glucose, Bld 549 (HH) 65 - 99 mg/dL   Calcium, Ion 1.06 (L) 1.15 - 1.40 mmol/L   TCO2 21 0 - 100 mmol/L   Hemoglobin 15.3 13.0 - 17.0 g/dL   HCT 45.0 39.0 - 52.0 %   Comment NOTIFIED PHYSICIAN   I-Stat CG4 Lactic Acid, ED     Status: None   Collection Time: 04/14/16  9:11 AM  Result Value Ref Range   Lactic Acid, Venous 1.72 0.5 - 1.9 mmol/L  Urinalysis, Routine w reflex microscopic (not at [facility])     Status: Abnormal   Collection Time: 04/14/16  9:18 AM  Result Value Ref Range   Color, Urine YELLOW YELLOW   APPearance CLEAR CLEAR   Specific Gravity, Urine 1.046 (H) 1.005 - 1.030   pH 5.5 5.0 - 8.0   Glucose, UA >1000 (A) NEGATIVE mg/dL    Hgb urine dipstick TRACE (A) NEGATIVE   Bilirubin Urine NEGATIVE NEGATIVE   Ketones, ur 40 (A) NEGATIVE mg/dL   Protein, ur 100 (A) NEGATIVE mg/dL   Nitrite NEGATIVE NEGATIVE   Leukocytes, UA NEGATIVE NEGATIVE  Urine microscopic-add on     Status: Abnormal   Collection Time: 04/14/16  9:18 AM  Result Value Ref Range   Squamous Epithelial / LPF 6-30 (A) NONE SEEN   WBC, UA 6-30 0 - 5 WBC/hpf   RBC / HPF NONE SEEN 0 - 5 RBC/hpf   Bacteria, UA FEW (A) NONE SEEN   Urine-Other YEAST PRESENT   Basic metabolic panel     Status: Abnormal   Collection Time: 04/14/16  9:25 AM  Result Value Ref Range   Sodium 131 (L) 135 - 145 mmol/L   Potassium 4.5 3.5 - 5.1 mmol/L   Chloride 94 (L) 101 - 111 mmol/L   CO2 21 (L) 22 - 32 mmol/L   Glucose, Bld 533 (HH) 65 - 99 mg/dL    Comment: CRITICAL RESULT CALLED TO, READ BACK BY AND VERIFIED WITH: DR.DELO @ 0093 04/14/16 LEONARD,A    BUN 10 6 - 20 mg/dL   Creatinine, Ser 1.31 (H) 0.61 - 1.24 mg/dL   Calcium 9.7 8.9 - 10.3 mg/dL   GFR calc non Af Amer >60 >60 mL/min   GFR calc Af Amer >60 >60 mL/min    Comment: (NOTE) The eGFR has been calculated using the CKD EPI equation. This calculation has not been validated in all clinical situations. eGFR's persistently <60 mL/min signify possible Chronic Kidney Disease.    Anion gap 16 (H) 5 - 15  Blood culture (routine x 2)     Status: None (Preliminary result)   Collection Time: 04/14/16 10:05 AM  Result Value Ref Range   Specimen Description BLOOD RIGHT ANTECUBITAL    Special Requests BOTTLES DRAWN AEROBIC AND ANAEROBIC 10CC    Culture NO GROWTH 1 DAY    Report Status PENDING   Blood culture (routine x 2)     Status: None (Preliminary result)   Collection Time: 04/14/16 10:10 AM  Result Value Ref Range   Specimen Description BLOOD RIGHT HAND    Special Requests BOTTLES DRAWN AEROBIC AND ANAEROBIC 5CC    Culture NO GROWTH 1 DAY    Report Status PENDING   CBG monitoring, ED     Status: Abnormal    Collection Time: 04/14/16 11:07 AM  Result Value Ref Range   Glucose-Capillary 424 (H) 65 - 99 mg/dL  CBG monitoring, ED     Status: Abnormal   Collection Time: 04/14/16 12:14 PM  Result Value Ref Range   Glucose-Capillary 415 (H) 65 - 99 mg/dL  CBG monitoring, ED     Status: Abnormal   Collection Time: 04/14/16  1:19 PM  Result Value Ref Range   Glucose-Capillary 307 (H) 65 - 99 mg/dL  Hemoglobin A1c     Status: Abnormal   Collection Time: 04/14/16  1:21 PM  Result Value Ref Range   Hgb A1c MFr Bld >15.5 (H) 4.8 - 5.6 %    Comment: (NOTE) **Verified by repeat analysis**         Pre-diabetes: 5.7 - 6.4         Diabetes: >6.4         Glycemic control for adults with diabetes: <7.0    Mean Plasma Glucose >398 mg/dL    Comment: (NOTE) Performed At: [facility] [place], [place] [id number] Lindon Romp MD OM:[id number]   Lipid panel     Status: Abnormal   Collection Time: 04/14/16  1:21 PM  Result Value Ref Range   Cholesterol 201 (H) 0 - 200 mg/dL   Triglycerides 178 (H) <150 mg/dL   HDL 34 (L) >40 mg/dL   Total CHOL/HDL Ratio 5.9 RATIO   VLDL 36 0 - 40 mg/dL   LDL Cholesterol 131 (H) 0 - 99 mg/dL    Comment:        Total Cholesterol/HDL:CHD Risk Coronary Heart Disease Risk Table                     Men   Women  1/2 Average Risk   3.4   3.3  Average Risk       5.0   4.4  2 X Average Risk   9.6   7.1  3 X Average Risk  23.4   11.0        Use the calculated Patient Ratio above and the CHD Risk Table to determine the patient's CHD Risk.        ATP III CLASSIFICATION (LDL):  <100     mg/dL   Optimal  100-129  mg/dL   Near or Above                    Optimal  130-159  mg/dL   Borderline  160-189  mg/dL   High  >190     mg/dL   Very High   Basic metabolic panel     Status: Abnormal   Collection Time: 04/14/16  1:30 PM  Result Value Ref Range   Sodium 134 (L) 135 - 145 mmol/L   Potassium 3.9 3.5 - 5.1 mmol/L   Chloride 101 101 - 111  mmol/L   CO2 24 22 - 32 mmol/L   Glucose, Bld 303 (H) 65 - 99 mg/dL   BUN 9 6 - 20 mg/dL   Creatinine, Ser 0.94 0.61 - 1.24 mg/dL   Calcium 8.8 (L) 8.9 - 10.3 mg/dL   GFR calc non Af Amer >60 >60 mL/min   GFR calc Af Amer >60 >60 mL/min    Comment: (NOTE) The eGFR has been calculated using the CKD EPI equation. This calculation has not been validated in all clinical situations. eGFR's persistently <60 mL/min signify possible Chronic Kidney Disease.    Anion gap 9 5 - 15  HIV antibody     Status: None   Collection Time: 04/14/16  1:30  PM  Result Value Ref Range   HIV Screen 4th Generation wRfx Non Reactive Non Reactive    Comment: (NOTE) Performed At: [facility] [address] [place], [place] [id number] Lindon Romp MD JO:[id number]   RPR     Status: None   Collection Time: 04/14/16  1:30 PM  Result Value Ref Range   RPR Ser Ql Non Reactive Non Reactive    Comment: (NOTE) Performed At: [facility] [place], [place] [id number] Lindon Romp MD TD:[id number]   Aerobic Culture (superficial specimen)     Status: None   Collection Time: 04/14/16  2:09 PM  Result Value Ref Range   Specimen Description SCROTUM    Special Requests NONE    Gram Stain      FEW WBC PRESENT, PREDOMINANTLY MONONUCLEAR FEW GRAM POSITIVE COCCI IN PAIRS FEW GRAM VARIABLE ROD    Culture      ABUNDANT GROUP B STREP(S.AGALACTIAE)ISOLATED TESTING AGAINST S. AGALACTIAE NOT ROUTINELY PERFORMED DUE TO PREDICTABILITY OF AMP/PEN/VAN SUSCEPTIBILITY.    Report Status 04/15/2016 FINAL   CBG monitoring, ED     Status: Abnormal   Collection Time: 04/14/16  2:17 PM  Result Value Ref Range   Glucose-Capillary 284 (H) 65 - 99 mg/dL  MRSA PCR Screening     Status: None   Collection Time: 04/14/16  2:55 PM  Result Value Ref Range   MRSA by PCR NEGATIVE NEGATIVE    Comment:        The GeneXpert MRSA Assay (FDA approved for NASAL specimens only), is one component of  a comprehensive MRSA colonization surveillance program. It is not intended to diagnose MRSA infection nor to guide or monitor treatment for MRSA infections.   Glucose, capillary     Status: Abnormal   Collection Time: 04/14/16  3:27 PM  Result Value Ref Range   Glucose-Capillary 219 (H) 65 - 99 mg/dL  Basic metabolic panel     Status: Abnormal   Collection Time: 04/14/16  5:08 PM  Result Value Ref Range   Sodium 134 (L) 135 - 145 mmol/L   Potassium 3.6 3.5 - 5.1 mmol/L   Chloride 103 101 - 111 mmol/L   CO2 25 22 - 32 mmol/L   Glucose, Bld 155 (H) 65 - 99 mg/dL   BUN 8 6 - 20 mg/dL   Creatinine, Ser 0.83 0.61 - 1.24 mg/dL   Calcium 8.7 (L) 8.9 - 10.3 mg/dL   GFR calc non Af Amer >60 >60 mL/min   GFR calc Af Amer >60 >60 mL/min    Comment: (NOTE) The eGFR has been calculated using the CKD EPI equation. This calculation has not been validated in all clinical situations. eGFR's persistently <60 mL/min signify possible Chronic Kidney Disease.    Anion gap 6 5 - 15  Glucose, capillary     Status: Abnormal   Collection Time: 04/14/16  5:15 PM  Result Value Ref Range   Glucose-Capillary 139 (H) 65 - 99 mg/dL  Glucose, capillary     Status: Abnormal   Collection Time: 04/14/16  6:31 PM  Result Value Ref Range   Glucose-Capillary 171 (H) 65 - 99 mg/dL  Glucose, capillary     Status: Abnormal   Collection Time: 04/14/16  7:24 PM  Result Value Ref Range   Glucose-Capillary 170 (H) 65 - 99 mg/dL  Glucose, capillary     Status: Abnormal   Collection Time: 04/14/16  8:33 PM  Result Value Ref Range   Glucose-Capillary  196 (H) 65 - 99 mg/dL  Glucose, capillary     Status: Abnormal   Collection Time: 04/14/16  9:33 PM  Result Value Ref Range   Glucose-Capillary 229 (H) 65 - 99 mg/dL  Glucose, capillary     Status: Abnormal   Collection Time: 04/14/16 10:28 PM  Result Value Ref Range   Glucose-Capillary 277 (H) 65 - 99 mg/dL  Basic metabolic panel     Status: Abnormal    Collection Time: 04/15/16  4:03 AM  Result Value Ref Range   Sodium 133 (L) 135 - 145 mmol/L   Potassium 4.4 3.5 - 5.1 mmol/L    Comment: DELTA CHECK NOTED   Chloride 100 (L) 101 - 111 mmol/L   CO2 25 22 - 32 mmol/L   Glucose, Bld 328 (H) 65 - 99 mg/dL   BUN 9 6 - 20 mg/dL   Creatinine, Ser 0.95 0.61 - 1.24 mg/dL   Calcium 8.8 (L) 8.9 - 10.3 mg/dL   GFR calc non Af Amer >60 >60 mL/min   GFR calc Af Amer >60 >60 mL/min    Comment: (NOTE) The eGFR has been calculated using the CKD EPI equation. This calculation has not been validated in all clinical situations. eGFR's persistently <60 mL/min signify possible Chronic Kidney Disease.    Anion gap 8 5 - 15  CBC     Status: Abnormal   Collection Time: 04/15/16  4:03 AM  Result Value Ref Range   WBC 7.8 4.0 - 10.5 K/uL   RBC 4.13 (L) 4.22 - 5.81 MIL/uL   Hemoglobin 12.6 (L) 13.0 - 17.0 g/dL   HCT 37.5 (L) 39.0 - 52.0 %   MCV 90.8 78.0 - 100.0 fL   MCH 30.5 26.0 - 34.0 pg   MCHC 33.6 30.0 - 36.0 g/dL   RDW 12.2 11.5 - 15.5 %   Platelets 199 150 - 400 K/uL  Glucose, capillary     Status: Abnormal   Collection Time: 04/15/16  8:02 AM  Result Value Ref Range   Glucose-Capillary 317 (H) 65 - 99 mg/dL  Glucose, capillary     Status: Abnormal   Collection Time: 04/15/16 12:37 PM  Result Value Ref Range   Glucose-Capillary 331 (H) 65 - 99 mg/dL  Glucose, capillary     Status: Abnormal   Collection Time: 04/15/16  4:22 PM  Result Value Ref Range   Glucose-Capillary 382 (H) 65 - 99 mg/dL   Recent Results (from the past 240 hour(s))  Blood culture (routine x 2)     Status: None (Preliminary result)   Collection Time: 04/14/16 10:05 AM  Result Value Ref Range Status   Specimen Description BLOOD RIGHT ANTECUBITAL  Final   Special Requests BOTTLES DRAWN AEROBIC AND ANAEROBIC 10CC  Final   Culture NO GROWTH 1 DAY  Final   Report Status PENDING  Incomplete  Blood culture (routine x 2)     Status: None (Preliminary result)   Collection  Time: 04/14/16 10:10 AM  Result Value Ref Range Status   Specimen Description BLOOD RIGHT HAND  Final   Special Requests BOTTLES DRAWN AEROBIC AND ANAEROBIC 5CC  Final   Culture NO GROWTH 1 DAY  Final   Report Status PENDING  Incomplete  Aerobic Culture (superficial specimen)     Status: None   Collection Time: 04/14/16  2:09 PM  Result Value Ref Range Status   Specimen Description SCROTUM  Final   Special Requests NONE  Final   Gram Stain  Final    FEW WBC PRESENT, PREDOMINANTLY MONONUCLEAR FEW GRAM POSITIVE COCCI IN PAIRS FEW GRAM VARIABLE ROD    Culture   Final    ABUNDANT GROUP B STREP(S.AGALACTIAE)ISOLATED TESTING AGAINST S. AGALACTIAE NOT ROUTINELY PERFORMED DUE TO PREDICTABILITY OF AMP/PEN/VAN SUSCEPTIBILITY.    Report Status 04/15/2016 FINAL  Final  MRSA PCR Screening     Status: None   Collection Time: 04/14/16  2:55 PM  Result Value Ref Range Status   MRSA by PCR NEGATIVE NEGATIVE Final    Comment:        The GeneXpert MRSA Assay (FDA approved for NASAL specimens only), is one component of a comprehensive MRSA colonization surveillance program. It is not intended to diagnose MRSA infection nor to guide or monitor treatment for MRSA infections.    Creatinine:  Recent Labs  04/14/16 0859 04/14/16 0925 04/14/16 1330 04/14/16 1708 04/15/16 0403  CREATININE 1.00 1.31* 0.94 0.83 0.95    Xrays: See report/chart noted  Impression/Assessment:  Draining scrotal abscess;   Plan:  Recommend antibiotics; and check pending c/s; sitz baths might be very helpful or at least hot compresses; no surgery needed and unlikey; will follow Thanks   Sybella Harnish A 04/15/2016, 4:52 PM

## 2016-04-16 DIAGNOSIS — N492 Inflammatory disorders of scrotum: Secondary | ICD-10-CM

## 2016-04-16 DIAGNOSIS — B951 Streptococcus, group B, as the cause of diseases classified elsewhere: Secondary | ICD-10-CM

## 2016-04-16 LAB — GLUCOSE, CAPILLARY
Glucose-Capillary: 301 mg/dL — ABNORMAL HIGH (ref 65–99)
Glucose-Capillary: 388 mg/dL — ABNORMAL HIGH (ref 65–99)
Glucose-Capillary: 236 mg/dL — ABNORMAL HIGH (ref 65–99)

## 2016-04-16 MED ORDER — NAPROXEN 500 MG PO TABS: 500.0000 mg | ORAL_TABLET | Freq: Two times a day (BID) | ORAL | 0 refills | Status: DC

## 2016-04-16 MED ORDER — AGAMATRIX PRESTO W/DEVICE KIT: 1.0000 | PACK | Freq: Three times a day (TID) | 0 refills | Status: DC

## 2016-04-16 MED ORDER — "INSULIN SYRINGE-NEEDLE U-100 31G X 15/64"" 0.5 ML MISC": 1.0000 | Freq: Two times a day (BID) | 11 refills | Status: DC

## 2016-04-16 MED ORDER — AMOXICILLIN-POT CLAVULANATE 875-125 MG PO TABS: 1.0000 | ORAL_TABLET | Freq: Two times a day (BID) | ORAL | 0 refills | Status: DC

## 2016-04-16 MED ORDER — INSULIN GLARGINE 100 UNIT/ML ~~LOC~~ SOLN
45.0000 [IU] | Freq: Every day | SUBCUTANEOUS | Status: DC
Start: 2016-04-16 — End: 2016-04-16
  Filled 2016-04-16: qty 0.45

## 2016-04-16 MED ORDER — INSULIN ASPART 100 UNIT/ML ~~LOC~~ SOLN
5.0000 [IU] | Freq: Three times a day (TID) | SUBCUTANEOUS | Status: DC
  Administered 2016-04-16 (×2): 5 [IU] via SUBCUTANEOUS

## 2016-04-16 MED ORDER — LISINOPRIL 20 MG PO TABS: 20.0000 mg | ORAL_TABLET | Freq: Every day | ORAL | 11 refills | Status: DC

## 2016-04-16 MED ORDER — AMOXICILLIN-POT CLAVULANATE 875-125 MG PO TABS
1.0000 | ORAL_TABLET | Freq: Two times a day (BID) | ORAL | Status: DC
  Administered 2016-04-16: 1 via ORAL
  Filled 2016-04-16: qty 1

## 2016-04-16 MED ORDER — LANCET DEVICE MISC: 1.0000 | Freq: Two times a day (BID) | 11 refills | Status: DC

## 2016-04-16 MED ORDER — ATORVASTATIN CALCIUM 40 MG PO TABS
40.0000 mg | ORAL_TABLET | Freq: Every day | ORAL | 11 refills | Status: DC
Start: 2016-04-16 — End: 2016-04-16

## 2016-04-16 MED ORDER — GLUCOSE BLOOD VI STRP: 1.0000 | ORAL_STRIP | Freq: Two times a day (BID) | 12 refills | Status: DC

## 2016-04-16 MED ORDER — LISINOPRIL 20 MG PO TABS
20.0000 mg | ORAL_TABLET | Freq: Every day | ORAL | Status: DC
  Administered 2016-04-16: 20 mg via ORAL
  Filled 2016-04-16: qty 1

## 2016-04-16 MED ORDER — INSULIN NPH ISOPHANE & REGULAR (70-30) 100 UNIT/ML ~~LOC~~ SUSP: 45.0000 [IU] | Freq: Two times a day (BID) | SUBCUTANEOUS | 11 refills | Status: DC

## 2016-04-16 MED ORDER — METFORMIN HCL 1000 MG PO TABS: 1000.0000 mg | ORAL_TABLET | Freq: Two times a day (BID) | ORAL | 11 refills | Status: DC

## 2016-04-16 MED ORDER — CLOTRIMAZOLE 1 % EX CREA: TOPICAL_CREAM | Freq: Two times a day (BID) | CUTANEOUS | 0 refills | Status: DC

## 2016-04-16 MED ORDER — ATORVASTATIN CALCIUM 40 MG PO TABS: 40.0000 mg | ORAL_TABLET | Freq: Every day | ORAL | 11 refills | Status: DC

## 2016-04-16 MED ORDER — CLOTRIMAZOLE 1 % EX CREA
TOPICAL_CREAM | Freq: Two times a day (BID) | CUTANEOUS | Status: DC
  Administered 2016-04-16: 11:00:00 via TOPICAL
  Filled 2016-04-16: qty 15

## 2016-04-16 MED ORDER — FLUCONAZOLE 150 MG PO TABS
150.0000 mg | ORAL_TABLET | Freq: Once | ORAL | Status: AC
  Administered 2016-04-16: 150 mg via ORAL
  Filled 2016-04-16: qty 1

## 2016-04-16 NOTE — Discharge Summary (Signed)
Name: Joshua Petty MRN: [id number] DOB: 23-Nov-1965 50 y.o. PCP: No Pcp Per Patient  Date of Admission: 04/14/2016  8:28 AM Date of Discharge: 04/17/2016 Attending Physician: Dr. Beryle Petty  Discharge Diagnosis: Principal Problem:   Scrotal abscess Active Problems:   Acute renal failure ([place])   Marijuana abuse   DKA (diabetic ketoacidoses) ([place])   Discharge Medications:   Medication List    STOP taking these medications   amLODipine 10 MG tablet Commonly known as:  NORVASC   doxycycline 100 MG capsule Commonly known as:  VIBRAMYCIN   fenofibrate 145 MG tablet Commonly known as:  TRICOR   hydrochlorothiazide 25 MG tablet Commonly known as:  HYDRODIURIL   Insulin Syringe-Needle U-100 25G X 1" 1 ML Misc Replaced by:  Insulin Syringe-Needle U-100 31G X 15/64" 0.5 ML Misc   metoprolol 100 MG tablet Commonly known as:  LOPRESSOR     TAKE these medications   AGAMATRIX PRESTO w/Device Kit 1 each by Does not apply route 4 (four) times daily -  before meals and at bedtime.   amoxicillin-clavulanate 875-125 MG tablet Commonly known as:  AUGMENTIN Take 1 tablet by mouth every 12 (twelve) hours.   atorvastatin 40 MG tablet Commonly known as:  LIPITOR Take 1 tablet (40 mg total) by mouth daily at 6 PM.   clotrimazole 1 % cream Commonly known as:  LOTRIMIN Apply topically 2 (two) times daily. Apply to penis.   glucose blood test strip Commonly known as:  WAVESENSE PRESTO 1 each by Other route 2 (two) times daily before Petty meal. Use as instructed   insulin NPH-regular Human (70-30) 100 UNIT/ML injection Commonly known as:  NOVOLIN 70/30 Inject 45 Units into the skin 2 (two) times daily with Petty meal.   Insulin Syringe-Needle U-100 31G X 15/64" 0.5 ML Misc 1 each by Does not apply route 2 (two) times daily before Petty meal. Replaces:  Insulin Syringe-Needle U-100 25G X 1" 1 ML Misc   Lancet Device Misc 1 each by Does not apply route 2 (two) times daily with Petty  meal.   lisinopril 20 MG tablet Commonly known as:  PRINIVIL,ZESTRIL Take 1 tablet (20 mg total) by mouth daily.   metFORMIN 1000 MG tablet Commonly known as:  GLUCOPHAGE Take 1 tablet (1,000 mg total) by mouth 2 (two) times daily with Petty meal. What changed:  medication strength  how much to take   naproxen 500 MG tablet Commonly known as:  NAPROSYN Take 1 tablet (500 mg total) by mouth 2 (two) times daily with Petty meal.       Disposition and follow-up:   Joshua Petty was discharged from [facility] in Good condition.  At the hospital follow up visit please address:  Scrotal Abscess: Please assess improved after 14 day course of antibiotics with Augmentin BID. Patient is to follow up with Urology, [facility] and continue dressing changes.   Balanitis: Please assess improvement with Clotrimazole 1% BID and Fluconazole 150 mg po once.  T2DM: Please assess control on Novolog 70/30 45 units BID on discharge and Metformin 1000 mg BID.  AKI: Please repeat BMET as outpatient.  HTN: Please assess BP control on lisinopril 20 mg QD.  2.  Labs / imaging needed at time of follow-up: BMET  3.  Pending labs/ test needing follow-up: None  Follow-up Appointments: Follow-up Information    [place]. Schedule an appointment as soon as possible for Petty visit today.   Why:  in 1-2  weeks for follow up We will call you with an appointment Contact information: [address] [place] [address] [phone]       Joshua A, MD. Schedule an appointment as soon as possible for Petty visit today.   Specialty:  Urology Why:  follow up in one week Contact information: [address] [place] [address] [phone]           Hospital Course by problem list: Principal Problem:   Scrotal abscess Active Problems:   Acute renal failure (H[facility])   Marijuana abuse   DKA (diabetic ketoacidoses) ([facility])   [name] is Petty 50yo  man with PMHx of uncontrolled type 2 DM, HTN, and hyperlipidemia who presents today with Petty 3 week hx of worsening scrotal pain and swelling.   Scrotal Abscess: Patient presented with Petty 3 week hx of right-sided scrotal pain, swelling, and discharge and found to have Petty large abscess in his right testicle. I&D was performed in the ED and expressed about 400 cc of bloody pus. Cultures grew Strep Agalactiae. Patient remained afebrile without leukocytosis. BCx were NGTD. He was started on Clindamycin on admission for treatment. He was seen by Urology yesterday who recommended continuing abx, following cultures, sitz baths, warm compresses, but no indication for surgery at this time. Patient was seen by wound care who packed his abscess with gauze strip to facilitate drainage and demonstrated technique for patient to perform dressing changes himself after discharge. HIV and RPR negative. On discharge, patient will complete Petty 14 day course of antibiotics with Augmentin BID. He will follow up with Urology, [facility] and continue dressing changes.   Balanitis: Evidence of candidal infection of shaft of penis and head of penis. UA also positive for yeast. Likely secondary to being uncircumcised and poor glycemic control. Patient was treated with Clotrimazole 1% BID and Fluconazole 150 mg po once.  T2DM: Originally presented in DKA secondary to running out of his insulin and metformin and due to underlying infection. CBGs continue to be persistently elevated in the 300s while on Lantus 40 units QHS and SSI. HgbA1c >15 this admission. Patient was previously on Metformin and Novolog 70/30 45 units BID. Patient was restarted on Novolog 70/30 45 units BID on discharge and Metformin 1000 mg BID.  AKI: Resolved. Creatinine 0.95 yesterday. Please repeat BMET as outpatient.  HTN: BPs in 130s-140s/90s. Started lisinopril 20 mg QD.  Hyperlipidemia: ASCVD risk score of 17.8%. Atorvastatin 40 mg daily.   Discharge Vitals:    BP (!) 146/115 (BP Location: Left Arm)   Pulse 74   Temp 98.1 F (36.7 C) (Oral)   Resp 16   Ht 5' 7" (1.702 m)   Wt 228 lb 9.6 Joshua (103.7 kg)   SpO2 95%   BMI 35.80 kg/m    Discharge Instructions: Discharge Instructions    Diet - low sodium heart healthy    Complete by:  As directed    Increase activity slowly    Complete by:  As directed       Signed: Alexa Burns, DO PGY-3 Internal Medicine Resident Pager # [phone] 04/17/2016 6:22 PM   

## 2016-04-16 NOTE — Progress Notes (Signed)
CM met with pt in room and pt states he has an appointment with [facility] arranged by previous CM.  This CM gave pt [facility] letter with participating pharmacies to pt.  Pt verbalized understanding MATCH parameters.  No other CM needs were communicated.

## 2016-04-16 NOTE — Progress Notes (Signed)
Looks good Send home when OK with primary team Number given  I will. See in one week in clinic

## 2016-04-16 NOTE — Progress Notes (Signed)
Discharge teaching reviewed with pt. Pt understands process for wound care and discharge teaching. All pt belongings sent home with pt. Vital signs stable and pt ready for discharge.

## 2016-04-16 NOTE — Progress Notes (Signed)
   Subjective: Patient was seen and examined this morning. He states he feels well overall, no fever or chills, and continues to have good drainage from his right scrotum. He does complain of irritation at the head of his penis due to a white substance which is more bothersome to him than his scrotum.   Objective: Vital signs in last 24 hours: Vitals:   04/16/16 0200 04/16/16 0400 04/16/16 0600 04/16/16 0745  BP: (!) 138/99 (!) 138/95 (!) 137/91 (!) 146/115  Pulse: 78 82 74   Resp: 17 17 16    Temp:    98.1 F (36.7 C)  TempSrc:    Oral  SpO2: 95% 97% 95%   Weight:      Height:       Physical Exam General: Vital signs reviewed.  Patient is well-developed and well-nourished, in no acute distress and cooperative with exam.  Cardiovascular: RRR Pulmonary/Chest: Clear to auscultation bilaterally, no wheezes, rales, or rhonchi. Abdominal: Soft, non-tender, non-distended, BS + GU: Right side of scrotum with incision with serosanginous drainage and packing in place. Non-tender. Uncircumcised penis with white discharge consistent with candida surrounding head of penis.  Extremities: No lower extremity edema bilaterally  Assessment/Plan: Principal Problem:   Scrotal abscess Active Problems:   Acute renal failure (HCC)   Marijuana abuse   DKA (diabetic ketoacidoses) (H[facility])  [name] is a 50yo man with PMHx of uncontrolled type 2 DM, HTN, and hyperlipidemia who presents today with a 3 week hx of worsening scrotal pain and swelling.   Scrotal Abscess: Cultures are growing Strep Agalactiae. Patient remains afebrile without leukocytosis. BCx are NGTD. He was started on Clindamycin on admission for treatment. He was seen by Urology yesterday who recommended continuing abx, following cultures, sitz baths, warm compresses, but no indication for surgery at this time. Patient was seen by wound care who packed his abscess with gauze strip to facilitate drainage and demonstrated technique for patient  to perform dressing changes himself after discharge. HIV and RPR negative. -Continue wound care, follow up in our clinic -Clindamycin 9/28>>9/30 -Transition to oral antibiotics -Wound care -Percocet 5-325 mg Q4H PRN mild/moderate pain -Likely discharge to home today  Balanitis: Evidence of candidal infection of shaft of penis and head of penis. Likely secondary to being uncircumcised and poor glycemic control -Clotrimazole 1% BID -Fluconazole 150 mg po once  T2DM: Originally presented in DKA secondary to running out of his insulin and metformin and due to underlying infection. CBGs continue to be persistently elevated in the 300s while on Lantus 40 units QHS and SSI. HgbA1c >15 this admission. Patient was previously on Metformin and Novolog 70/30 45 units BID.  -Lantus 45 units QHS and Novolog TID WC while inpatient -While restart Novolog 70/30 45 units BID on discharge and Metformin 1000 mg BID -Will need close follow up; no PCP- will establish patient with out clinic -Patient has the orange card  AKI: Resolved. Creatinine 0.95 yesterday.  -Repeat BMET as outpatient  HTN: BPs in 130s-140s/90s.   -Start lisinopril 20 mg QD  Hyperlipidemia: ASCVD risk score of 17.8%. -Storvastatin 40 mg daily   FEN: Carb Mod DVT/PE ppx: Lovenox SQ CODE: FULL  Dispo: Anticipated discharge in approximately 0 day(s).   LOS: 2 days   Karlene LinemanAlexa Tavis Kring, DO PGY-3 Internal Medicine Resident Pager # [phone] 04/16/2016 9:26 AM

## 2016-04-19 LAB — CULTURE, BLOOD (ROUTINE X 2)
Culture: NO GROWTH
Culture: NO GROWTH

## 2016-04-21 ENCOUNTER — Ambulatory Visit (INDEPENDENT_AMBULATORY_CARE_PROVIDER_SITE_OTHER): Payer: Self-pay | Admitting: Internal Medicine

## 2016-04-21 VITALS — BP 137/80 | HR 93 | Temp 98.5°F | Wt 250.4 lb

## 2016-04-21 DIAGNOSIS — Z5189 Encounter for other specified aftercare: Secondary | ICD-10-CM

## 2016-04-21 DIAGNOSIS — K219 Gastro-esophageal reflux disease without esophagitis: Secondary | ICD-10-CM

## 2016-04-21 DIAGNOSIS — N179 Acute kidney failure, unspecified: Secondary | ICD-10-CM

## 2016-04-21 DIAGNOSIS — Z794 Long term (current) use of insulin: Secondary | ICD-10-CM

## 2016-04-21 DIAGNOSIS — Z79899 Other long term (current) drug therapy: Secondary | ICD-10-CM

## 2016-04-21 DIAGNOSIS — B3742 Candidal balanitis: Secondary | ICD-10-CM

## 2016-04-21 DIAGNOSIS — E1165 Type 2 diabetes mellitus with hyperglycemia: Secondary | ICD-10-CM

## 2016-04-21 DIAGNOSIS — E1100 Type 2 diabetes mellitus with hyperosmolarity without nonketotic hyperglycemic-hyperosmolar coma (NKHHC): Secondary | ICD-10-CM

## 2016-04-21 DIAGNOSIS — I10 Essential (primary) hypertension: Secondary | ICD-10-CM

## 2016-04-21 DIAGNOSIS — N481 Balanitis: Secondary | ICD-10-CM

## 2016-04-21 DIAGNOSIS — N492 Inflammatory disorders of scrotum: Secondary | ICD-10-CM

## 2016-04-21 DIAGNOSIS — B951 Streptococcus, group B, as the cause of diseases classified elsewhere: Secondary | ICD-10-CM

## 2016-04-21 LAB — GLUCOSE, CAPILLARY: Glucose-Capillary: 184 mg/dL — ABNORMAL HIGH (ref 65–99)

## 2016-04-21 NOTE — Progress Notes (Signed)
   CC: Scrotal abscess  HPI:  Joshua Petty is a 50 y.o. male with PMH of IDDM, HTN who presents for follow up management of a scrotal abscess.  Scrotal abscess: Hospitalized from 9/28-10/1. I&D in the ED removed ~400 ccs fluid. Urology saw patient while in hospital and decided surgery was not indicated. Cultures grew Group B strep, strep agalactiae. He was initially treated with IV Clindamycin then transitioned to oral Augmentin to complete a 14 day total course. Patient reports adherence to antibiotics without missed doses. He feels his abscess has dramatically improved down to the size of his thumbnail. He was packing the area but stopped 2 days ago as the packing would no longer fit. He continues to dress the area. He notices some drainage onto his dressings. He denies any pain, fevers, chills, diaphoresis, or urinary symptoms. He has not made a follow up appointment with Urology yet.  Balanitis: Given oral fluconazole once in the hospital and started on Clotrimazole cream. Patient reports improvement with some residual cracking of skin.  T2DM: Presented to hospital in DKA. Hgb A1c >15.5. He was started on Novolog 70/30 45 units BID and Metformin 1000 mg BID. He reports adherence to these. He admits not checking his blood sugars often, the last read being about 207 two days ago. He says he has a meter and strips at home. He denies any high or low symptoms.  HTN: Started on Lisinopril 20 mg daily in the hospital. BP is 137/80, reports adherence to Lisinopril.  GERD: Reports reflux symptoms occasionally when laying down at night that resolves quickly. Associates this to eating a "triple spicy" meal from a fast food restaurant recently.   Past Medical History:  Diagnosis Date  . Gonorrhea   . Hyperlipidemia   . Hypertension     Review of Systems:   Review of Systems  Constitutional: Negative for chills, diaphoresis and fever.  Respiratory: Negative for shortness of breath.     Cardiovascular: Negative for chest pain.  Gastrointestinal: Positive for heartburn. Negative for abdominal pain, blood in stool, constipation, diarrhea, melena, nausea and vomiting.  Genitourinary: Negative for dysuria, flank pain, frequency, hematuria and urgency.       Improved right scrotal abscess, some continued drainage. Improved balanitis with mild cracking of the foreskin.  Neurological: Negative for headaches.  Psychiatric/Behavioral: Negative for substance abuse.     Physical Exam:  Vitals:   04/21/16 1412  BP: 137/80  Pulse: 93  Temp: 98.5 F (36.9 C)  TempSrc: Oral  SpO2: 100%  Weight: 250 lb 6.4 oz (113.6 kg)   Physical Exam  Constitutional: He appears well-developed and well-nourished. No distress.  Cardiovascular: Normal rate and regular rhythm.   Pulmonary/Chest: Effort normal. No respiratory distress. He has no wheezes. He has no rales.  Genitourinary:  Genitourinary Comments: Right scrotum with 2 ~1cm incisions with slight serosanguinous drainage. Non tender. Firm to palpation without fluctuance. ~0.25 cm laceration at distal foreskin, no exudate.  Skin: He is not diaphoretic.    Assessment & Plan:   See Encounters Tab for problem based charting.  Patient discussed with Dr. Cleda DaubE. Hoffman

## 2016-04-21 NOTE — Patient Instructions (Signed)
It was a pleasure to meet you [name].  Please complete your antibiotics as prescribed.  Please continue your insulin and metformin as prescribed. Please check your blood sugars three times a day and bring your meter next visit.  Continue to use the Clotrimazole cream twice a day for up to 2 weeks.  Your blood pressure looks good, continue the lisinopril as prescribed.  Please make a follow up appointment with urology, Dr. McDiarmid in the next week.

## 2016-04-22 DIAGNOSIS — K219 Gastro-esophageal reflux disease without esophagitis: Secondary | ICD-10-CM | POA: Insufficient documentation

## 2016-04-22 DIAGNOSIS — N481 Balanitis: Secondary | ICD-10-CM | POA: Insufficient documentation

## 2016-04-22 DIAGNOSIS — I10 Essential (primary) hypertension: Secondary | ICD-10-CM | POA: Insufficient documentation

## 2016-04-22 LAB — BMP8+ANION GAP
Anion Gap: 16 mmol/L (ref 10.0–18.0)
BUN/Creatinine Ratio: 12 (ref 9–20)
BUN: 14 mg/dL (ref 6–24)
CO2: 22 mmol/L (ref 18–29)
Calcium: 9.1 mg/dL (ref 8.7–10.2)
Chloride: 100 mmol/L (ref 96–106)
Creatinine, Ser: 1.15 mg/dL (ref 0.76–1.27)
GFR calc Af Amer: 85 mL/min/{1.73_m2} (ref >59)
GFR calc non Af Amer: 74 mL/min/{1.73_m2} (ref >59)
Glucose: 178 mg/dL — ABNORMAL HIGH (ref 65–99)
Potassium: 4.4 mmol/L (ref 3.5–5.2)
Sodium: 138 mmol/L (ref 134–144)

## 2016-04-22 NOTE — Assessment & Plan Note (Signed)
Started on Lisinopril 20 mg daily in the hospital. BP is 137/80, reports adherence to Lisinopril. -Continue current medication -Serum creatinine okay this visit

## 2016-04-22 NOTE — Assessment & Plan Note (Signed)
Hospitalized from 9/28-10/1. I&D in the ED removed ~400 ccs fluid. Urology saw patient while in hospital and decided surgery was not indicated. Cultures grew Group B strep, strep agalactiae. He was initially treated with IV Clindamycin then transitioned to oral Augmentin to complete a 14 day total course. Patient reports adherence to antibiotics without missed doses. He feels his abscess has dramatically improved down to the size of his thumbnail. He was packing the area but stopped 2 days ago as the packing would no longer fit. He continues to dress the area. He notices some drainage onto his dressings. He denies any pain, fevers, chills, diaphoresis, or urinary symptoms. He has not made a follow up appointment with Urology yet.  Appears to be healing and draining on it's own. Patient will complete antibiotic course and f/u with Urology. -Complete Augmentin -Continue dressings -f/u with Urology -Continue glycemic control

## 2016-04-22 NOTE — Assessment & Plan Note (Signed)
Presented to hospital in DKA. Hgb A1c >15.5. He was started on Novolog 70/30 45 units BID and Metformin 1000 mg BID. He reports adherence to these. He admits not checking his blood sugars often, the last read being about 207 two days ago. He says he has a meter and strips at home. He denies any high or low symptoms.  CBG 184, serum glucose 178 this visit. -Continue current medications -Patient advised to check blood sugar three times daily and bring meter on follow up -f/u in 59month, Repeat A1c in 3 months

## 2016-04-22 NOTE — Assessment & Plan Note (Signed)
Given oral fluconazole once in the hospital and started on Clotrimazole cream. Patient reports improvement with some residual cracking of skin.  -Continue Clotrimazole cream BID for 1-3 weeks -Patient advised on genital hygiene

## 2016-04-22 NOTE — Assessment & Plan Note (Signed)
Reports reflux symptoms occasionally when laying down at night that resolves quickly. Associates this to eating a "triple spicy" meal from a fast food restaurant recently.   -Advised patient on avoidance/limitation of trigger foods including: Caffeine/tea/coffee, sodas, alcohol, spicy foods, chocolate, peppermint -Can consider PPI if symptoms persist

## 2016-04-26 NOTE — Progress Notes (Signed)
Internal Medicine Clinic Attending  Case discussed with Dr. Patel,Vishal at the time of the visit.  We reviewed the resident's history and exam and pertinent patient test results.  I agree with the assessment, diagnosis, and plan of care documented in the resident's note.  

## 2017-07-04 ENCOUNTER — Inpatient Hospital Stay (HOSPITAL_COMMUNITY)
Admission: AD | Admit: 2017-07-04 | Discharge: 2017-07-06 | DRG: 682 | Disposition: A | Discharge status: Home / Self Care | Payer: Self-pay | Source: Ambulatory Visit | Attending: Internal Medicine | Admitting: Internal Medicine

## 2017-07-04 ENCOUNTER — Ambulatory Visit: Payer: Self-pay | Admitting: Internal Medicine

## 2017-07-04 ENCOUNTER — Other Ambulatory Visit: Payer: Self-pay

## 2017-07-04 ENCOUNTER — Inpatient Hospital Stay (HOSPITAL_COMMUNITY): Payer: Self-pay

## 2017-07-04 ENCOUNTER — Encounter (HOSPITAL_COMMUNITY): Payer: Self-pay

## 2017-07-04 VITALS — BP 177/134 | HR 88 | Temp 97.0°F | Wt 290.6 lb

## 2017-07-04 DIAGNOSIS — J323 Chronic sphenoidal sinusitis: Secondary | ICD-10-CM | POA: Diagnosis present

## 2017-07-04 DIAGNOSIS — I10 Essential (primary) hypertension: Secondary | ICD-10-CM

## 2017-07-04 DIAGNOSIS — I161 Hypertensive emergency: Secondary | ICD-10-CM | POA: Diagnosis present

## 2017-07-04 DIAGNOSIS — E669 Obesity, unspecified: Secondary | ICD-10-CM

## 2017-07-04 DIAGNOSIS — E1139 Type 2 diabetes mellitus with other diabetic ophthalmic complication: Secondary | ICD-10-CM

## 2017-07-04 DIAGNOSIS — Z794 Long term (current) use of insulin: Secondary | ICD-10-CM

## 2017-07-04 DIAGNOSIS — R000 Tachycardia, unspecified: Secondary | ICD-10-CM | POA: Diagnosis not present

## 2017-07-04 DIAGNOSIS — Z6841 Body Mass Index (BMI) 40.0 and over, adult: Secondary | ICD-10-CM

## 2017-07-04 DIAGNOSIS — H539 Unspecified visual disturbance: Secondary | ICD-10-CM

## 2017-07-04 DIAGNOSIS — Z833 Family history of diabetes mellitus: Secondary | ICD-10-CM

## 2017-07-04 DIAGNOSIS — R51 Headache: Secondary | ICD-10-CM

## 2017-07-04 DIAGNOSIS — E1136 Type 2 diabetes mellitus with diabetic cataract: Secondary | ICD-10-CM | POA: Diagnosis present

## 2017-07-04 DIAGNOSIS — E785 Hyperlipidemia, unspecified: Secondary | ICD-10-CM | POA: Diagnosis present

## 2017-07-04 DIAGNOSIS — H5462 Unqualified visual loss, left eye, normal vision right eye: Secondary | ICD-10-CM | POA: Diagnosis present

## 2017-07-04 DIAGNOSIS — I160 Hypertensive urgency: Secondary | ICD-10-CM

## 2017-07-04 DIAGNOSIS — H538 Other visual disturbances: Secondary | ICD-10-CM | POA: Diagnosis present

## 2017-07-04 DIAGNOSIS — M25571 Pain in right ankle and joints of right foot: Secondary | ICD-10-CM | POA: Diagnosis not present

## 2017-07-04 DIAGNOSIS — E119 Type 2 diabetes mellitus without complications: Secondary | ICD-10-CM

## 2017-07-04 DIAGNOSIS — E1100 Type 2 diabetes mellitus with hyperosmolarity without nonketotic hyperglycemic-hyperosmolar coma (NKHHC): Secondary | ICD-10-CM | POA: Diagnosis present

## 2017-07-04 DIAGNOSIS — N179 Acute kidney failure, unspecified: Principal | ICD-10-CM | POA: Diagnosis present

## 2017-07-04 DIAGNOSIS — Z79899 Other long term (current) drug therapy: Secondary | ICD-10-CM

## 2017-07-04 DIAGNOSIS — Z7984 Long term (current) use of oral hypoglycemic drugs: Secondary | ICD-10-CM

## 2017-07-04 HISTORY — DX: Type 2 diabetes mellitus without complications: E11.9

## 2017-07-04 LAB — CBC WITH DIFFERENTIAL/PLATELET
Basophils Absolute: 0 10*3/uL (ref 0.0–0.1)
Basophils Relative: 1 %
Eosinophils Absolute: 0.2 10*3/uL (ref 0.0–0.7)
Eosinophils Relative: 3 %
HCT: 45.9 % (ref 39.0–52.0)
Hemoglobin: 16.1 g/dL (ref 13.0–17.0)
Lymphocytes Relative: 52 %
Lymphs Abs: 3 10*3/uL (ref 0.7–4.0)
MCH: 30.1 pg (ref 26.0–34.0)
MCHC: 35.1 g/dL (ref 30.0–36.0)
MCV: 85.8 fL (ref 78.0–100.0)
Monocytes Absolute: 0.5 10*3/uL (ref 0.1–1.0)
Monocytes Relative: 8 %
Neutro Abs: 2.1 10*3/uL (ref 1.7–7.7)
Neutrophils Relative %: 36 %
Platelets: 202 10*3/uL (ref 150–400)
RBC: 5.35 MIL/uL (ref 4.22–5.81)
RDW: 12.3 % (ref 11.5–15.5)
WBC: 5.8 10*3/uL (ref 4.0–10.5)

## 2017-07-04 LAB — COMPREHENSIVE METABOLIC PANEL
ALT: 17 U/L (ref 17–63)
AST: 24 U/L (ref 15–41)
Albumin: 4.2 g/dL (ref 3.5–5.0)
Alkaline Phosphatase: 50 U/L (ref 38–126)
Anion gap: 6 (ref 5–15)
BUN: 17 mg/dL (ref 6–20)
CO2: 29 mmol/L (ref 22–32)
Calcium: 9.5 mg/dL (ref 8.9–10.3)
Chloride: 102 mmol/L (ref 101–111)
Creatinine, Ser: 1.6 mg/dL — ABNORMAL HIGH (ref 0.61–1.24)
GFR calc Af Amer: 56 mL/min — ABNORMAL LOW (ref >60)
GFR calc non Af Amer: 48 mL/min — ABNORMAL LOW (ref >60)
Glucose, Bld: 150 mg/dL — ABNORMAL HIGH (ref 65–99)
Potassium: 4.3 mmol/L (ref 3.5–5.1)
Sodium: 137 mmol/L (ref 135–145)
Total Bilirubin: 1.2 mg/dL (ref 0.3–1.2)
Total Protein: 8.1 g/dL (ref 6.5–8.1)

## 2017-07-04 LAB — GLUCOSE, CAPILLARY
Glucose-Capillary: 156 mg/dL — ABNORMAL HIGH (ref 65–99)
Glucose-Capillary: 152 mg/dL — ABNORMAL HIGH (ref 65–99)
Glucose-Capillary: 125 mg/dL — ABNORMAL HIGH (ref 65–99)
Glucose-Capillary: 165 mg/dL — ABNORMAL HIGH (ref 65–99)

## 2017-07-04 LAB — MRSA PCR SCREENING: MRSA by PCR: NEGATIVE

## 2017-07-04 MED ORDER — ACETAMINOPHEN 650 MG RE SUPP: 650.0000 mg | Freq: Four times a day (QID) | RECTAL | Status: DC | PRN

## 2017-07-04 MED ORDER — AMLODIPINE BESYLATE 5 MG PO TABS
5.0000 mg | ORAL_TABLET | Freq: Every day | ORAL | Status: DC
  Administered 2017-07-04 – 2017-07-05 (×2): 5 mg via ORAL
  Filled 2017-07-04 (×2): qty 1

## 2017-07-04 MED ORDER — ENOXAPARIN SODIUM 40 MG/0.4ML ~~LOC~~ SOLN
40.0000 mg | SUBCUTANEOUS | Status: DC
  Administered 2017-07-04 – 2017-07-05 (×2): 40 mg via SUBCUTANEOUS
  Filled 2017-07-04 (×2): qty 0.4

## 2017-07-04 MED ORDER — INSULIN NPH (HUMAN) (ISOPHANE) 100 UNIT/ML ~~LOC~~ SUSP
30.0000 [IU] | Freq: Two times a day (BID) | SUBCUTANEOUS | Status: DC
  Filled 2017-07-04: qty 10

## 2017-07-04 MED ORDER — LISINOPRIL 20 MG PO TABS
20.0000 mg | ORAL_TABLET | Freq: Every day | ORAL | Status: DC
  Administered 2017-07-04 – 2017-07-06 (×3): 20 mg via ORAL
  Filled 2017-07-04 (×3): qty 1

## 2017-07-04 MED ORDER — LABETALOL HCL 5 MG/ML IV SOLN
10.0000 mg | INTRAVENOUS | Status: DC | PRN
  Administered 2017-07-05 (×5): 10 mg via INTRAVENOUS
  Filled 2017-07-04 (×3): qty 4

## 2017-07-04 MED ORDER — LABETALOL HCL 5 MG/ML IV SOLN
10.0000 mg | INTRAVENOUS | Status: DC | PRN
  Administered 2017-07-04: 10 mg via INTRAVENOUS
  Filled 2017-07-04: qty 4

## 2017-07-04 MED ORDER — INSULIN ASPART 100 UNIT/ML ~~LOC~~ SOLN
0.0000 [IU] | SUBCUTANEOUS | Status: DC
  Administered 2017-07-04 – 2017-07-05 (×2): 3 [IU] via SUBCUTANEOUS
  Administered 2017-07-05 (×3): 2 [IU] via SUBCUTANEOUS
  Administered 2017-07-05 (×2): 3 [IU] via SUBCUTANEOUS
  Administered 2017-07-06: 2 [IU] via SUBCUTANEOUS
  Administered 2017-07-06 (×2): 3 [IU] via SUBCUTANEOUS

## 2017-07-04 MED ORDER — INSULIN ASPART 100 UNIT/ML ~~LOC~~ SOLN: 0.0000 [IU] | Freq: Three times a day (TID) | SUBCUTANEOUS | Status: DC

## 2017-07-04 MED ORDER — LABETALOL HCL 5 MG/ML IV SOLN: 10.0000 mg | INTRAVENOUS | Status: DC | PRN

## 2017-07-04 MED ORDER — ACETAMINOPHEN 325 MG PO TABS
650.0000 mg | ORAL_TABLET | Freq: Four times a day (QID) | ORAL | Status: DC | PRN
  Administered 2017-07-06: 650 mg via ORAL
  Filled 2017-07-04: qty 2

## 2017-07-04 NOTE — Assessment & Plan Note (Addendum)
Patients BP elevated to 196/140, 184/140 on repeat. Patient reports new onset headache that started yesterday and stable blurred vision in L eye for the past week or so. Patient states that he has intermittently experienced blurred vision since July but it usually self resolves after a day or so. This is the first time he has experienced prolonged, blurred vision in L eye. Patient's PE exam is concerning, as the patient's L pupil does not react to light, he does not blink to threat on the L, and he cannot read letters at distance of 3 feet during exam (reports only seeing silhouette of examiner during eye exam). Given patient's elevated BP, reported persistent changes in vision x 1week, and findings on PE, I am concerned about hypertensive emergency (signs of end organ damage in setting of elevated BP) and/or thromboembolic stroke. The patient has multiple risk factors for stroke, including obesity, poorly controlled diabetes (last A1C >15% in 2017), HLD, and HTN. Plan to admit patient for BP control and stroke workup. Patient will likely need further lab work since he has not been seen by physician in >1 year and opthalmologic evaluation upon admission.   Plan: -Admit to inpatient for brain MRI, BP management, and ophthalmologic evaluation -STAT CMP, CBC to evaluate for further signs of end organ damage (liver, kidney) and/or platelet abnormalities

## 2017-07-04 NOTE — Progress Notes (Signed)
   CC: HTN follow up  HPI:  [name] is a 51 y.o. with PMH of HTN, T2DM, and HLD who is presenting for evaluation of chronic medical conditions. The patient was last seen in the clinic on 04/21/2017 after a recent hospitalization for I&D of a scrotal abscess. Per chart review, the patient's infection was improving and was 1 year supply of medications for chronic medical conditions. The patient states that he has been out of his medications for greater than 1 month during today's visit. The patient does not measure his BP or blood sugar at home, so he is unsure how his HTN and T2DM have been doing since his last visit. Patient states that that about a week ago he first noticed some blurred vision in his L eye and difficulty seeing objects even if they are close up in spite of using glasses. He reports only being able to see blurred shapes and/or silhouettes from his L eye. No problems reported in R eye. Patient states that he intermittently experienced these symptoms since July, but they usually last for a day and self resolve. Patient reports frontal headache at the onset of changes in L eye that started a week ago.    Past Medical History: Past Medical History:  Diagnosis Date  . Gonorrhea   . Hyperlipidemia   . Hypertension    Review of Systems:  Patient endorses changes in vision of L eye, as per HPI Patient denies fevers, chest pain, shortness of breath, abdominal pain, diaphoresis, nausea/vomiting, lower extremity swelling, and change in bowel/bladder habits.  Physical Exam:  Vitals:   07/04/17 0944  BP: (!) 196/140  Pulse: 96  Temp: (!) 97 F (36.1 C)  TempSrc: Oral  SpO2: 100%  Weight: 290 lb 9.6 oz (131.8 kg)   Physical Exam  Constitutional: He appears well-developed and well-nourished. No distress.  HENT:  Mouth/Throat: Oropharynx is clear and moist.  Eyes: Right eye exhibits no discharge. Left eye exhibits no discharge. No scleral icterus.  R pupil round and  reactive to light; L pupil round but does not react to light. Patient reacts to threat on R but does not react to threat on L. Patient can read ID badge letters from 3 feet with R eye; cannot read letters on with L eye. EOM intact with horizontal nystagmus with R eye deviation.   Cardiovascular: Normal rate, regular rhythm and intact distal pulses. Exam reveals no friction rub.  No murmur heard. Respiratory: Effort normal. No respiratory distress. He has no wheezes.  No crackles appreciated.  GI: Soft. He exhibits no distension. There is no tenderness. There is no rebound.  Musculoskeletal: He exhibits no edema (of bilateral lower extremities) or tenderness (of bilateral lower extremities).  Neurological:  Face strength and sensation intact bilaterally. Tongue midline. 5/5 bicep, tricep, and grip strength bilaterally. 5/5 hip flexion, dorsiflexion, and plantarflexion bilaterally. Gross sensation to light touch of upper and lower extremities intact bilaterally. RAM intact bilaterally.  Skin: Skin is warm and dry. No rash noted. He is not diaphoretic. No erythema.   Assessment & Plan:   See Encounters Tab for problem based charting.  Patient seen with Dr. Rogelia BogaButcher

## 2017-07-04 NOTE — H&P (Signed)
Date: 07/04/2017               Patient Name:  Joshua Petty MRN: [id number]  DOB: 1965/09/22 Age / Sex: 51 y.o., male   PCP: Patient, No Pcp Per         Medical Service: Internal Medicine Teaching Service         Attending Physician: Dr. Sandre Kittyaines, Elwin MochaAlexander N, MD    First Contact: Dr. Minda MeoLaCroce Pager: [phone]  Second Contact: Dr. Samuella CotaSvalina Pager: [phone]       After Hours (After 5p/  First Contact Pager: [phone]  weekends / holidays): Second Contact Pager: [phone]   Chief Complaint: Left eye partial vision loss  History of Present Illness: [name] is a 51 yo man with a history of hypertension and hyperlipidemia who presented to clinic due to blurry vision with a worsening blind spot. This has been ongoing with varying severity for several months as early as July but now for one week he reports persistent inability to see clearly despite wearing his glasses. The center of his vision with his left eye also feels like a blind spot. He also feels some frontal headache that is worst on the left around or behind his eye, that he thinks might be from straining to see. He has noticed no changes in his right eye. He has never experienced similar vision changes before this. He also reports that he ran out of his medications 1 month ago so has taking nothing since then. He felt that he is exercising more and hoped the blood pressure medicine would be less important now.  In clinic he was also found to be severely hypertensive to 196/140. He was also found to have AKI on metabolic panel. He denies any chest pain, shortness of breath, peripheral edema, dyspnea on exertion, urinary symptoms, or abdominal pain.  Meds:  No outpatient medications have been marked as taking for the 07/04/17 encounter [facility](Hospital Encounter).     Allergies: Allergies as of 07/04/2017  . (No Known Allergies)   Past Medical History:  Diagnosis Date  . Gonorrhea   . Hyperlipidemia   . Hypertension     Family  History:  Family History  Problem Relation Age of Onset  . Diabetes Father   . Diabetes Sister    Social History: Social History   Socioeconomic History  . Marital status: Single    Spouse name: Not on file  . Number of children: Not on file  . Years of education: Not on file  . Highest education level: Not on file  Social Needs  . Financial resource strain: Not on file  . Food insecurity - worry: Not on file  . Food insecurity - inability: Not on file  . Transportation needs - medical: Not on file  . Transportation needs - non-medical: Not on file  Occupational History  . Not on file  Tobacco Use  . Smoking status: Never Smoker  . Smokeless tobacco: Never Used  Substance and Sexual Activity  . Alcohol use: No  . Drug use: Yes    Types: Marijuana    Comment: 3 days/week  . Sexual activity: Not on file  Other Topics Concern  . Not on file  Social History Narrative  . Not on file    Review of Systems: A complete ROS was negative except as per HPI.  Physical Exam: Vitals:   07/04/17 0944  BP: (!) 196/140  Pulse: 96  Temp: (!) 97 F (36.1 C)  TempSrc: Oral  SpO2: 100%  Weight: 290 lb 9.6 oz (131.8 kg)   GENERAL- alert, co-operative, NAD HEENT- Left eye has dense cataract present, outward deviation/disconjugate gaze, he has no reaction to visual threat from the right in his left eye, left pupil is dilated and has minimal reaction to light CARDIAC- RRR, no murmurs, rubs or gallops. RESP- CTAB, no wheezes or crackles. NEURO- Strength upper and lower extremities- 5/5, Sensation intact globally, no facial asymmetry EXTREMITIES- Symmetric, no pedal edema. SKIN- Warm, dry, No rash or lesion.   Assessment & Plan by Problem: Hypertensive emergency He has severe systolic and diastolic hypertension and has headache, visual changes, and AKI suggesting end organ injury. He needs to have blood pressure controlled to under 180/110 quickly so inpatient admission  recommended. This degree of elevation seems out of proportion to just missing his lisinopril for 1 month. -Check EKG -Resume lisinopril, start amlodipine, PRN labetalol to target <180/110 today -Repeat Bmet in AM for AKI progression  Left eye vision changes This is concerning for neurologic process as he appears to be neglecting a lateral visual field in one eye. If retinal perfusion emergency or detachment, he has already missed the time window for urgent intervention. MRI head will help indicate neuro vs ophthalmologic process. -MRI head ordered -HTN control as above -Needs follow up evaluation with neuro or optho as appropriate  Hyperlipidemia LDL of 131 and total cholesterol 201. He is also diabetic. He was on atorvastatin 40mg  but off x1 month. This needs to be resumed at discharge if not earlier.  T2DM He takes NPH 45U BID. This seems unlikely to explain sudden and focal vision loss. NPH insulin 30U BID SSI-M CBGs TIDAC  FULL CODE VTE ppx: Enoxaparin Regular Diet  Dispo: Admit patient to Inpatient with expected length of stay greater than 2 midnights.  Signed: Fuller Planhristopher W Laityn Bensen, MD PGY-III Internal Medicine Resident Pager# [phone] 07/04/2017, 3:58 PM

## 2017-07-04 NOTE — Progress Notes (Signed)
CSW received consult that patient is out of medications.  Medication assistance is provided through case management- CSW updated RNCM who will follow up with patient  CSW signing off  Burna SisJenna H. Ciarrah Rae, LCSW Clinical Social Worker [phone]

## 2017-07-05 ENCOUNTER — Other Ambulatory Visit: Payer: Self-pay

## 2017-07-05 DIAGNOSIS — N179 Acute kidney failure, unspecified: Secondary | ICD-10-CM | POA: Diagnosis present

## 2017-07-05 DIAGNOSIS — E1136 Type 2 diabetes mellitus with diabetic cataract: Secondary | ICD-10-CM

## 2017-07-05 DIAGNOSIS — H5462 Unqualified visual loss, left eye, normal vision right eye: Secondary | ICD-10-CM | POA: Diagnosis present

## 2017-07-05 DIAGNOSIS — E119 Type 2 diabetes mellitus without complications: Secondary | ICD-10-CM

## 2017-07-05 DIAGNOSIS — I161 Hypertensive emergency: Secondary | ICD-10-CM

## 2017-07-05 LAB — BASIC METABOLIC PANEL
Anion gap: 10 (ref 5–15)
BUN: 16 mg/dL (ref 6–20)
CO2: 26 mmol/L (ref 22–32)
Calcium: 9.4 mg/dL (ref 8.9–10.3)
Chloride: 102 mmol/L (ref 101–111)
Creatinine, Ser: 1.62 mg/dL — ABNORMAL HIGH (ref 0.61–1.24)
GFR calc Af Amer: 55 mL/min — ABNORMAL LOW (ref >60)
GFR calc non Af Amer: 48 mL/min — ABNORMAL LOW (ref >60)
Glucose, Bld: 138 mg/dL — ABNORMAL HIGH (ref 65–99)
Potassium: 3.6 mmol/L (ref 3.5–5.1)
Sodium: 138 mmol/L (ref 135–145)

## 2017-07-05 LAB — GLUCOSE, CAPILLARY
Glucose-Capillary: 123 mg/dL — ABNORMAL HIGH (ref 65–99)
Glucose-Capillary: 132 mg/dL — ABNORMAL HIGH (ref 65–99)
Glucose-Capillary: 133 mg/dL — ABNORMAL HIGH (ref 65–99)
Glucose-Capillary: 146 mg/dL — ABNORMAL HIGH (ref 65–99)
Glucose-Capillary: 155 mg/dL — ABNORMAL HIGH (ref 65–99)
Glucose-Capillary: 168 mg/dL — ABNORMAL HIGH (ref 65–99)
Glucose-Capillary: 199 mg/dL — ABNORMAL HIGH (ref 65–99)

## 2017-07-05 LAB — HEMOGLOBIN A1C
Hgb A1c MFr Bld: 6.9 % — ABNORMAL HIGH (ref 4.8–5.6)
Mean Plasma Glucose: 151.33 mg/dL

## 2017-07-05 MED ORDER — LABETALOL HCL 5 MG/ML IV SOLN
10.0000 mg | INTRAVENOUS | Status: DC | PRN
  Administered 2017-07-06 (×2): 10 mg via INTRAVENOUS
  Filled 2017-07-05 (×3): qty 4

## 2017-07-05 MED ORDER — ATORVASTATIN CALCIUM 40 MG PO TABS
40.0000 mg | ORAL_TABLET | Freq: Every day | ORAL | Status: DC
  Administered 2017-07-05: 40 mg via ORAL
  Filled 2017-07-05: qty 1

## 2017-07-05 NOTE — Progress Notes (Signed)
   Subjective: Patient seen and examined, no acute events overnight. He states he is feeling well this morning and has no acute complaints. He states that his vision has been blurry vision on and off for approximately 4-5 months. He complains of glare with driving at night as well as blurry vision. He states that  Vision in his right eye is normal, but he is only seeing shapes and shadows with his left eye. Denies floaters or curtain like veil over his vision. No pain associated with the blurry vision.   Patient also denies chest pain or shortness of breath. Encouraged to get out of bed to chair and walk around.   Objective:  Vital signs in last 24 hours: Vitals:   07/05/17 0000 07/05/17 0100 07/05/17 0400 07/05/17 0736  BP: (!) 160/107 (!) 148/123 (!) 148/114   Pulse: 86 71 81   Resp: 11 12 12    Temp:    98.6 F (37 C)  TempSrc:    Oral  SpO2: 90% 93% (!) 87%   Weight:      Height:       General: Laying in bed comfortably, NAD HEENT: [place]/AT EYE: Vision in left eye hand motion only, EOMI, no scleral icterus or conjunctival erythema, left pupil sluggish but reactive to light, right pupil reactive to light appropriately, cataract noted bilaterally Cardiac: RRR, No R/M/G appreciated Pulm: normal effort, CTAB Abd: soft, non tender, non distended, BS normal Ext: extremities well perfused, no peripheral edema Neuro: alert and oriented X3, cranial nerves II-XII grossly intact   Assessment/Plan:  Active Problems:   Hypertensive emergency  Hypertensive emergency AKI BP improved, 139/103, reduction at goal since admission. EKG without evidence of ischemic changes, similar to prior EKG in 2014. Creatinine remains elevated on repeat BMET, 1.62 this AM. May be patient's new baseline.  -Continue lisinopril 20 mg -Continue amlodipine 5 mg -Repeat BMET in AM for AKI progression  Left eye vision changes MRI negative for intracranial process that would explain visual changes. MRI is  negative for acute infarction, white matter changes compatible with chronic small vessel ischemic disease; right basal ganglia faint signal abnormality favoring old ischemia. Vision  change most likely due to retinal  vein occlusions (central or branch) especially in the setting of hypertensive emergency. Visual changes at night including glare with headlights when driving consistent with cataract. PE and history are not concerning for ophthalmic emergency, no inpatient evaluation needed at this time.  -Will need outpatient ophthalmology for fundoscopic examination and evaluation  -Will evaluate pressure vis tonopen   T2DM Currently well controlled with A1C of 6.9 a significant reduction form prior A1C of >15.5. Home regimen includes NPH (70/30) 45U BID and metformin 1000 mg BID. Patient had ran out of medications about one month ago. BGs at goal ranging between 130-165.  -SSI-M -CBGs TIDAC -Will reassess need for NPH 45 units BID at discharge as diabetes currently well controlled  Hyperlipidemia Restarted atorvastatin 40 mg daily.    Dispo: Anticipated discharge in approximately 1 day.   Toney RakesLacroce, Samantha J, MD 07/05/2017, 8:41 AM Pager: [phone]

## 2017-07-05 NOTE — Progress Notes (Signed)
   07/05/17 1100  Clinical Encounter Type  Visited With Patient  Visit Type Initial  Referral From Nurse  Consult/Referral To Chaplain  Spiritual Encounters  Spiritual Needs Literature   Responded to a SCC for information on a HCPA.  Was able to explain the information and patient said he would look it over.  Patient is ready to get better to get home, he was resting and able to understand the information.  Will follow as needed. Chaplain Agustin CreeNewton Penny Arrambide

## 2017-07-05 NOTE — Care Management Note (Signed)
Case Management Note  Patient Details  Name: Meredeth Idelexander Wingler MRN: [id number] Date of Birth: 05-17-1966  Subjective/Objective:    From home, pta indep, he has no insurance no PCP.  NCM scheduled follow up apt with [facility] Jan [facility]rd at 9 am, he will also be able to utilize the [facility]  For  Medications.  NCM gave him the brochure for [facility] and for [facility]. He will need paper scripts or [facility].              Action/Plan: NCM will follow for dc needs.   Expected Discharge Date:  07/06/17               Expected Discharge Plan:  Home/Self Care  In-House Referral:     Discharge planning Services  CM Consult, Follow-up appt scheduled, [facility]igent [facility], Medication Assistance  Post Acute Care Choice:    Choice offered to:     DME Arranged:    DME Agency:     HH Arranged:    HH Agency:     Status of Service:  Completed, signed off  If discussed at [employer] Meetings, dates discussed:    Additional Comments:  Leone Havenaylor, Galdino Hinchman [place], RN 07/05/2017, 4:59 PM

## 2017-07-06 ENCOUNTER — Other Ambulatory Visit: Payer: Self-pay | Admitting: Pharmacist

## 2017-07-06 DIAGNOSIS — J323 Chronic sphenoidal sinusitis: Secondary | ICD-10-CM

## 2017-07-06 DIAGNOSIS — E1100 Type 2 diabetes mellitus with hyperosmolarity without nonketotic hyperglycemic-hyperosmolar coma (NKHHC): Secondary | ICD-10-CM

## 2017-07-06 DIAGNOSIS — I161 Hypertensive emergency: Secondary | ICD-10-CM

## 2017-07-06 LAB — BASIC METABOLIC PANEL
Anion gap: 11 (ref 5–15)
BUN: 13 mg/dL (ref 6–20)
CO2: 22 mmol/L (ref 22–32)
Calcium: 9.2 mg/dL (ref 8.9–10.3)
Chloride: 103 mmol/L (ref 101–111)
Creatinine, Ser: 1.56 mg/dL — ABNORMAL HIGH (ref 0.61–1.24)
GFR calc Af Amer: 58 mL/min — ABNORMAL LOW (ref >60)
GFR calc non Af Amer: 50 mL/min — ABNORMAL LOW (ref >60)
Glucose, Bld: 154 mg/dL — ABNORMAL HIGH (ref 65–99)
Potassium: 3.8 mmol/L (ref 3.5–5.1)
Sodium: 136 mmol/L (ref 135–145)

## 2017-07-06 LAB — GLUCOSE, CAPILLARY
Glucose-Capillary: 164 mg/dL — ABNORMAL HIGH (ref 65–99)
Glucose-Capillary: 152 mg/dL — ABNORMAL HIGH (ref 65–99)

## 2017-07-06 MED ORDER — DICLOFENAC SODIUM 1 % TD GEL: 2.0000 g | Freq: Four times a day (QID) | TRANSDERMAL | 0 refills | Status: DC

## 2017-07-06 MED ORDER — ATORVASTATIN CALCIUM 40 MG PO TABS: 40.0000 mg | ORAL_TABLET | Freq: Every day | ORAL | 11 refills | Status: DC

## 2017-07-06 MED ORDER — INSULIN NPH ISOPHANE & REGULAR (70-30) 100 UNIT/ML ~~LOC~~ SUSP: 5.0000 [IU] | Freq: Two times a day (BID) | SUBCUTANEOUS | 11 refills | Status: DC

## 2017-07-06 MED ORDER — AMLODIPINE BESYLATE 10 MG PO TABS: 10.0000 mg | ORAL_TABLET | Freq: Every day | ORAL | 0 refills | Status: DC

## 2017-07-06 MED ORDER — LISINOPRIL 20 MG PO TABS: 20.0000 mg | ORAL_TABLET | Freq: Every day | ORAL | 11 refills | Status: DC

## 2017-07-06 MED ORDER — DICLOFENAC SODIUM 1 % TD GEL
4.0000 g | Freq: Four times a day (QID) | TRANSDERMAL | Status: DC
  Administered 2017-07-06: 10:00:00 4 g via TOPICAL
  Filled 2017-07-06: qty 100

## 2017-07-06 MED ORDER — METOPROLOL SUCCINATE ER 50 MG PO TB24
50.0000 mg | ORAL_TABLET | Freq: Every day | ORAL | Status: DC
Start: 2017-07-06 — End: 2017-07-06
  Administered 2017-07-06: 50 mg via ORAL
  Filled 2017-07-06: qty 1

## 2017-07-06 MED ORDER — METOPROLOL SUCCINATE ER 50 MG PO TB24: 50.0000 mg | ORAL_TABLET | Freq: Every day | ORAL | 0 refills | Status: DC

## 2017-07-06 MED ORDER — AMLODIPINE BESYLATE 10 MG PO TABS
10.0000 mg | ORAL_TABLET | Freq: Every day | ORAL | Status: DC
  Administered 2017-07-06: 10 mg via ORAL
  Filled 2017-07-06: qty 1

## 2017-07-06 MED ORDER — METFORMIN HCL 1000 MG PO TABS: 1000.0000 mg | ORAL_TABLET | Freq: Two times a day (BID) | ORAL | 11 refills | Status: DC

## 2017-07-06 NOTE — Progress Notes (Signed)
Patient was discharged home. A&OX4. Room air. Discharge instructions reviewed with patient, including new medications,  where to get rx's, place and times of upcoming appointments. Transported to front entrance via wheelchair where SO was waiting. All belongings went with patient.

## 2017-07-06 NOTE — Progress Notes (Signed)
   Subjective:  Patient seen and examined. States he is doing well this morning. Denies chest pain or shortness of breath. Left eye vision unchanged, no eye pain or headache. Patients only complaint is that he woke up with right ankle pain. He states he somehow twisted in bed overnight, is able to bare weight.   Patient is amenable to discharge today.   Objective:  Vital signs in last 24 hours: Vitals:   07/06/17 0100 07/06/17 0135 07/06/17 0300 07/06/17 0600  BP: (!) 154/122 116/86 (!) 157/111 132/89  Pulse: 84 99 93 100  Resp: (!) 24 (!) 27 18 18   Temp:   98.2 F (36.8 C)   TempSrc:   Oral   SpO2: 94% 95% 92% 96%  Weight:      Height:       General: Laying in bed comfortably, NAD HEENT: [place]/AT EYE: Vision in left eye hand motion only, EOMI, no scleral icterus or conjunctival erythema, diffuse corneal clouding of left eye, left pupil sluggish but reactive to light, right pupil reactive to light appropriately, cataract noted bilaterally Cardiac: RRR, No R/M/G appreciated Pulm: normal effort, CTAB Abd: soft, non tender, non distended, BS normal Ext: extremities well perfused, no peripheral edema Neuro: alert and oriented X3, cranial nerves II-XII grossly intact   Assessment/Plan:  Principal Problem:   AKI (acute kidney injury) (HCC) Active Problems:   Hypertensive emergency   Vision loss, left eye  Hypertensive emergency AKI BP improved initially 132/89>164/119. Creatinine today on repeat BMET 1.56, most likely new baseline. Patient needed 2 doses of PRN labetalol at midnight and 4 AM, per vitals most likely due to diastolic pressures >110. Has also been tachycardic with heart rates into the low 100s -Continue lisinopril 20 mg -Increased amlodipine 10 mg -Start Metoprolol succinate 50 mg daily    Left eye vision changes MRI negative for intracranial process that would explain visual changes.Vision change most likely due to retinal  vein occlusions (central or branch)  especially in the setting of hypertensive emergency. Visual changes at night including glare with headlights when driving consistent with cataract. PE and history are not concerning for ophthalmic emergency, no inpatient evaluation needed at this time.  -Will need outpatient ophthalmology for fundoscopic examination and evaluation, will contact Dr. Laruth BouchardGroat's office today  T2DM Currently well controlled with A1C of 6.9 a significant reduction form prior A1C of >15.5. Home regimen includes NPH (70/30) 45U BID and metformin 1000 mg BID. Patient had ran out of medications about one month ago. BGs at goal ranging between 130-165.  -Will d/c on NPH 5 units BID and metformin as diabetes currently well controlled, discussed importance of BG monitoring at home  Right ankle pain Not swollen or erythematous on exam. Patient is able to bear weight. Improved with Voltaren gel.  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  Toney RakesLacroce, Samantha J, MD 07/06/2017, 7:18 AM Pager: [phone]

## 2017-07-06 NOTE — Progress Notes (Signed)
Internal Medicine Clinic Attending  I saw and evaluated the patient.  I personally confirmed the key portions of the history and exam documented by Dr. Nedrud and I reviewed pertinent patient test results.  The assessment, diagnosis, and plan were formulated together and I agree with the documentation in the resident's note.  

## 2017-07-06 NOTE — Progress Notes (Signed)
[facility] for atorvastatin, lisinopril, and metformin. Prescriptions sent. Patient requested ongoing help with med access, [facility]

## 2017-07-06 NOTE — Plan of Care (Signed)
Patient is A&Ox4. Pain in right ankle is decreased with topical gel. Verbalizes understanding of the need to take his medications at home as prescribed.

## 2017-07-07 NOTE — Discharge Summary (Addendum)
Name: Joshua Petty MRN: [id number] DOB: 1965/10/23 51 y.o. PCP: Thomasene Ripple, MD  Date of Admission: 07/04/2017  3:52 PM Date of Discharge: 07/06/2017 Attending Physician: Oda Kilts, MD   Discharge Diagnosis: 1. Hypertensive Emergency, AKI Principal Problem:   AKI (acute kidney injury) (Person) Active Problems:   Uncontrolled type 2 diabetes mellitus with hyperosmolar nonketotic hyperglycemia ([place])   Morbid obesity ([place])   Hypertensive emergency   Vision loss, left eye   Discharge Medications: Allergies as of 07/06/2017   No Known Allergies     Medication List    TAKE these medications   amLODipine 10 MG tablet Commonly known as:  NORVASC Take 1 tablet (10 mg total) by mouth daily.   diclofenac sodium 1 % Gel Commonly known as:  VOLTAREN Apply 2 g topically 4 (four) times daily.   insulin NPH-regular Human (70-30) 100 UNIT/ML injection Commonly known as:  NOVOLIN 70/30 Inject 5 Units into the skin 2 (two) times daily with a meal. What changed:  how much to take   metoprolol succinate 50 MG 24 hr tablet Commonly known as:  TOPROL-XL Take 1 tablet (50 mg total) by mouth daily. Take with or immediately following a meal.       Disposition and follow-up:   Joshua Petty was discharged from [facility] in Good condition.  At the hospital follow up visit please address:  1.  Hospital problems below:  Hypertensive Emergency -BP remained elevated during admission with systolics ranging from 478-295 and diastolic BP ranging from 62-130 -BP medications prior to hospitalization: Lisinopril 20 mg and Amlodipine 5 mg -BP medications at discharge: Lisinopril 20 mg, Amlodipine 10 mg, Metoprolol succinate 50 mg -Has the patient been able to start increased dose of amlodipine and metoprolol? -Please discuss the importance of BP control and the risks of elevated BP  AKI -Serum creatinine 1.6 on admission and 1.56 at discharge -Most  likely patient's new baseline -Please repeat a basic metabolic panel   Left eye visual loss -Patient hand motion only, with dilated sluggish pupil, and diffuse corneal clouding, EOMI -MRI of performed which showed no evidence of acute stroke, but did show severe chronic left sphenoid sinusitis -MRI did reveal a RIGHT basal ganglia faint signal abnormality favoring old ischemia. Radiology recommends: follow-up post gadolinium sequences versus repeat MRI of the head in 3-6 months. -Patient was sent to [facility] the same day as discharge. Did patient attend appointment? Diagnosis? Treatment? Improvement in vision? -Please obtain records from the office -If negative ophthalmic work up -> Consider ENT referral for sphenoid sinusitis, which if severe can also cause vision loss/blurry vision  T2DM -Hemoglobin A1C 6.9 -Discharge on 5 units of 70/30 NPH BID and Metformin 1000 mg BID -Patient's blood glucose on only 5 units 70/30 NPH BID (previously on 45 units BID)?   2.  Labs / imaging needed at time of follow-up: BMET to check creatinine, MRI in 3-6 months from discharge to re-evaluate faint basal ganglia signal   3.  Pending labs/ test needing follow-up: none   Follow-up Appointments: Follow-up [place] Follow up on 07/20/2017.   Why:  9 am for hospital follow up Contact information: [place] [phone] [place] Hospital Course by problem list: Principal Problem:   AKI (acute kidney injury) ([place]) Active Problems:   Uncontrolled type 2 diabetes mellitus with hyperosmolar nonketotic  hyperglycemia ([place])   Morbid obesity ([place])   Hypertensive emergency   Vision loss, left eye   1. Left eye visual loss, Hypertensive emergency, AKI [name] presented to the [place] [facility] with elevated BP of 196/140, left eye visual loss, and AKI on BMET with a creatine of 1.6 elevated  from a baseline of about 1. He was admitted to [facility] and the [facility] for blood pressure management and MRI to rule out acute infarct. On arrival to the hospital floor, he was in no acute distress, tachycardic, hypertensive, and saturating 100% on room air. He was started on PRN labetalol for blood pressure control and was sent for MRI of the brain without contrast (see more detailed MRI results below).   MRI did not reveal an intraparenchymal abnormality that would explain is left eye vision loss. It did show severe and chronic left sphenoid sinusitis, visualized ocular globes and orbital contents were non-suspicious.  During hospitalization the patients vision remained hand motion only and an appointment at [facility] was available same day of discharge.   His creatinine was monitored with daily basic metabolic panels, and on discharge was 1.56.   Blood pressure was reduced at an appropriate rate and was managed with PRN labetalol. His home medications were restarted which included Lisinopril 20 mg and Amlodipine 5 mg. Prior to discharge, amlodipine was increased to 10 mg daily and he was started on 50 mg of metoprolol.     2. Type 2 DM Repeat A1C was 6.9, much improved from a prior A1C in September 2017 of >15.5. During hospitaliztion, the patient's blood glucose met inpatient goals and he was maintained on SSI. He was discharged on metformin 1000 mg BID and NPH 70/30 5 units BID.   Discharge Vitals:   BP (!) 165/110   Pulse (!) 107   Temp 98.3 F (36.8 C) (Oral)   Resp (!) 27   Ht _0  (1.702 m)   Wt 290 lb (131.5 kg)   SpO2 98%   BMI 45.42 kg/m   Pertinent Labs, Studies, and Procedures:  BMP Latest Ref Rng & Units 07/06/2017 07/05/2017 07/04/2017  Glucose 65 - 99 mg/dL 154(H) 138(H) 150(H)  BUN 6 - 20 mg/dL _1 Creatinine 0.61 - 1.24 mg/dL 1.56(H) 1.62(H) 1.60(H)  BUN/Creat Ratio 9 - 20 - - -  Sodium 135 - 145 mmol/L 136 138 137  Potassium 3.5 - 5.1  mmol/L 3.8 3.6 4.3  Chloride 101 - 111 mmol/L 103 102 102  CO2 22 - 32 mmol/L _2 Calcium 8.9 - 10.3 mg/dL 9.2 9.4 9.5   CBC Latest Ref Rng & Units 07/04/2017 04/15/2016 04/14/2016  WBC 4.0 - 10.5 K/uL 5.8 7.8 -  Hemoglobin 13.0 - 17.0 g/dL 16.1 12.6(L) 15.3  Hematocrit 39.0 - 52.0 % 45.9 37.5(L) 45.0  Platelets 150 - 400 K/uL 202 199 -   MRI BRAIN WO CONTRAST FINDINGS: BRAIN: No reduced diffusion to suggest acute ischemia. No susceptibility artifact to suggest hemorrhage. The ventricles and sulci are upper limits of normal for patient's age. Faint T2 hyperintense signal RIGHT caudate head without volume loss. Patchy supratentorial white matter FLAIR T2 hyperintensities. No suspicious parenchymal signal, mass or mass effect. No abnormal extra-axial fluid collections.  VASCULAR: Major intracranial vascular flow voids maintained at skull base with mild dolichoectasia associated with chronic hypertension.  SKULL AND UPPER CERVICAL SPINE: No abnormal sellar expansion. No suspicious calvarial bone marrow signal. Craniocervical junction maintained.  SINUSES/ORBITS:  Severe chronic LEFT sphenoid sinusitis, mild general paranasal sinus mucosal thickening. The included ocular globes and orbital contents are non-suspicious.  IMPRESSION: 1. No acute intracranial process. 2. Mild white matter changes compatible with chronic small vessel ischemic disease. 3. RIGHT basal ganglia faint signal abnormality favoring old ischemia, less likely mass. Recommend follow-up post gadolinium sequences versus repeat MRI of the head in 3-6 months.  Discharge Instructions: Discharge Instructions    Call MD for:   Complete by:  As directed    Call MD for:  difficulty breathing, headache or visual disturbances   Complete by:  As directed    Call MD for:  persistant nausea and vomiting   Complete by:  As directed    Call MD for:  severe uncontrolled pain   Complete by:  As directed    Diet -  low sodium heart healthy   Complete by:  As directed    Discharge instructions   Complete by:  As directed    Joshua Petty, Joshua Petty were admitted for very high blood pressure and left eye vision loss. We did an MRI, which did not show a stroke as the cause of your vision loss. I have scheduled you an ophthalmology appointment with [place] at 1 PM, I have provided you the address below:  [facility]  [place], [place] [address]  I have added a blood pressure medication to your regimen. It is called Metoprolol. Please take 50 mg of metoprolol daily. I have also increased your amlodipine from 5 mg to 10 mg. Continue to take lisinopril 20 mg daily for blood pressure.  I also adjusted your insulin. Take 5 units twice daily. Please take your blood sugars before meals so we can monitor your sugars and determine if that is enough insulin for you. Continue to take metformin 1000 mg BID.   Increase activity slowly   Complete by:  As directed       Signed: Melanee Spry, MD 07/07/2017, 11:11 AM   Pager: [phone]   Internal Medicine Attending Note:  I saw and examined the patient on the day of discharge. I reviewed and agree with the discharge summary written by the house staff.

## 2017-07-20 ENCOUNTER — Ambulatory Visit: Payer: Self-pay | Admitting: Family Medicine

## 2017-07-20 ENCOUNTER — Other Ambulatory Visit: Payer: Self-pay | Admitting: Pharmacist

## 2017-07-20 DIAGNOSIS — E1100 Type 2 diabetes mellitus with hyperosmolarity without nonketotic hyperglycemic-hyperosmolar coma (NKHHC): Secondary | ICD-10-CM

## 2017-07-20 DIAGNOSIS — I161 Hypertensive emergency: Secondary | ICD-10-CM

## 2017-07-20 MED ORDER — LISINOPRIL 20 MG PO TABS: 20.0000 mg | ORAL_TABLET | Freq: Every day | ORAL | 11 refills | Status: DC

## 2017-07-20 MED ORDER — METFORMIN HCL 1000 MG PO TABS: 1000.0000 mg | ORAL_TABLET | Freq: Two times a day (BID) | ORAL | 3 refills | Status: DC

## 2017-07-20 MED ORDER — METOPROLOL SUCCINATE ER 50 MG PO TB24: 50.0000 mg | ORAL_TABLET | Freq: Every day | ORAL | 0 refills | Status: DC

## 2017-07-20 MED ORDER — INSULIN ISOPHANE & REGULAR (HUMAN 70-30)100 UNIT/ML KWIKPEN: 5.0000 [IU] | PEN_INJECTOR | Freq: Two times a day (BID) | SUBCUTANEOUS | 11 refills | Status: DC

## 2017-07-20 MED ORDER — AMLODIPINE BESYLATE 10 MG PO TABS: 10.0000 mg | ORAL_TABLET | Freq: Every day | ORAL | 0 refills | Status: DC

## 2017-07-20 MED ORDER — ATORVASTATIN CALCIUM 40 MG PO TABS: 40.0000 mg | ORAL_TABLET | Freq: Every day | ORAL | 3 refills | Status: DC

## 2017-07-20 NOTE — Progress Notes (Signed)
Patient was [facility] for [facility]. Sent prescriptions to pharmacy.

## 2017-08-09 ENCOUNTER — Telehealth: Payer: Self-pay | Admitting: *Deleted

## 2017-08-09 DIAGNOSIS — E1100 Type 2 diabetes mellitus with hyperosmolarity without nonketotic hyperglycemic-hyperosmolar coma (NKHHC): Secondary | ICD-10-CM

## 2017-08-09 DIAGNOSIS — I161 Hypertensive emergency: Secondary | ICD-10-CM

## 2017-08-10 MED ORDER — METOPROLOL SUCCINATE ER 50 MG PO TB24: 50.0000 mg | ORAL_TABLET | Freq: Every day | ORAL | 0 refills | Status: DC

## 2017-08-10 MED ORDER — AMLODIPINE BESYLATE 10 MG PO TABS: 10.0000 mg | ORAL_TABLET | Freq: Every day | ORAL | 0 refills | Status: DC

## 2017-08-11 NOTE — Telephone Encounter (Signed)
Patient is scheduled to see you on 08/28/17 at 3:15 pm.

## 2017-08-14 ENCOUNTER — Other Ambulatory Visit: Payer: Self-pay | Admitting: *Deleted

## 2017-08-14 DIAGNOSIS — E1100 Type 2 diabetes mellitus with hyperosmolarity without nonketotic hyperglycemic-hyperosmolar coma (NKHHC): Secondary | ICD-10-CM

## 2017-08-15 MED ORDER — AMLODIPINE BESYLATE 10 MG PO TABS: 10.0000 mg | ORAL_TABLET | Freq: Every day | ORAL | 0 refills | Status: DC

## 2017-08-28 ENCOUNTER — Encounter: Payer: Self-pay | Admitting: Internal Medicine

## 2017-09-21 ENCOUNTER — Other Ambulatory Visit: Payer: Self-pay | Admitting: Internal Medicine

## 2017-09-21 DIAGNOSIS — E1100 Type 2 diabetes mellitus with hyperosmolarity without nonketotic hyperglycemic-hyperosmolar coma (NKHHC): Secondary | ICD-10-CM

## 2017-10-11 ENCOUNTER — Encounter: Payer: Self-pay | Admitting: Internal Medicine

## 2017-10-11 ENCOUNTER — Ambulatory Visit: Payer: Self-pay

## 2017-11-22 ENCOUNTER — Other Ambulatory Visit: Payer: Self-pay | Admitting: Internal Medicine

## 2017-11-22 DIAGNOSIS — E1100 Type 2 diabetes mellitus with hyperosmolarity without nonketotic hyperglycemic-hyperosmolar coma (NKHHC): Secondary | ICD-10-CM

## 2018-01-03 ENCOUNTER — Encounter: Payer: Self-pay | Admitting: *Deleted

## 2018-01-11 ENCOUNTER — Other Ambulatory Visit: Payer: Self-pay | Admitting: Internal Medicine

## 2018-01-11 DIAGNOSIS — E1100 Type 2 diabetes mellitus with hyperosmolarity without nonketotic hyperglycemic-hyperosmolar coma (NKHHC): Secondary | ICD-10-CM

## 2018-01-17 ENCOUNTER — Ambulatory Visit: Payer: Self-pay

## 2018-01-22 ENCOUNTER — Encounter: Payer: Self-pay | Admitting: Internal Medicine

## 2018-01-22 ENCOUNTER — Ambulatory Visit: Payer: Self-pay

## 2018-06-13 ENCOUNTER — Encounter: Payer: Self-pay | Admitting: Internal Medicine

## 2018-06-13 ENCOUNTER — Ambulatory Visit: Payer: Self-pay

## 2018-06-21 ENCOUNTER — Ambulatory Visit (INDEPENDENT_AMBULATORY_CARE_PROVIDER_SITE_OTHER): Payer: Self-pay | Admitting: Pharmacist

## 2018-06-21 VITALS — BP 152/97 | HR 87

## 2018-06-21 DIAGNOSIS — I10 Essential (primary) hypertension: Secondary | ICD-10-CM

## 2018-06-21 DIAGNOSIS — E1100 Type 2 diabetes mellitus with hyperosmolarity without nonketotic hyperglycemic-hyperosmolar coma (NKHHC): Secondary | ICD-10-CM

## 2018-06-21 DIAGNOSIS — I161 Hypertensive emergency: Secondary | ICD-10-CM

## 2018-06-21 LAB — GLUCOSE, POCT (MANUAL RESULT ENTRY): POC Glucose: 75 mg/dl (ref 70–99)

## 2018-06-21 MED ORDER — SITAGLIP PHOS-METFORMIN HCL ER 50-1000 MG PO TB24: 1.0000 | ORAL_TABLET | Freq: Two times a day (BID) | ORAL | 0 refills | Status: DC

## 2018-06-21 MED ORDER — LISINOPRIL-HYDROCHLOROTHIAZIDE 20-12.5 MG PO TABS: 1.0000 | ORAL_TABLET | Freq: Every day | ORAL | 0 refills | Status: DC

## 2018-06-21 MED ORDER — AMLODIPINE-ATORVASTATIN 10-40 MG PO TABS: 1.0000 | ORAL_TABLET | Freq: Every day | ORAL | 3 refills | Status: DC

## 2018-06-21 NOTE — Patient Instructions (Signed)
Patient educated about medication as defined in this encounter and verbalized understanding by repeating back instructions provided.   

## 2018-06-21 NOTE — Progress Notes (Addendum)
S: Joshua Petty is a 52 y.o. male reports to clinical pharmacist appointment for help with BP/medication management  No Known Allergies Medication Sig  amLODipine-atorvastatin (CADUET) 10-40 MG tablet Take 1 tablet by mouth daily.  diclofenac sodium (VOLTAREN) 1 % GEL Apply 2 g topically 4 (four) times daily.  lisinopril-hydrochlorothiazide (PRINZIDE,ZESTORETIC) 20-12.5 MG tablet Take 1 tablet by mouth daily.  SitaGLIPtin-MetFORMIN HCl (JANUMET XR) 50-1000 MG TB24 Take 1 tablet by mouth 2 (two) times daily with a meal.   Past Medical History:  Diagnosis Date  . Diabetes (HCC)   . Gonorrhea   . Hyperlipidemia   . Hypertension    Social History   Socioeconomic History  . Marital status: Single    Spouse name: Not on file  . Number of children: Not on file  . Years of education: Not on file  . Highest education level: 12th grade  Occupational History  . Occupation: Software engineerhvac    Employer: Production assistant, [employer]  Social Needs  . Financial resource strain: Not very hard  . Food insecurity:    Worry: Never true    Inability: Never true  . Transportation needs:    Medical: No    Non-medical: No  Tobacco Use  . Smoking status: Never Smoker  . Smokeless tobacco: Never Used  Substance and Sexual Activity  . Alcohol use: No  . Drug use: Yes    Types: Marijuana    Comment: 3 days/week  . Sexual activity: Yes    Partners: Female    Birth control/protection: None  Lifestyle  . Physical activity:    Days per week: 2 days    Minutes per session: 30 min  . Stress: Not at all  Relationships  . Social connections:    Talks on phone: Once a week    Gets together: Once a week    Attends religious service: Never    Active member of club or organization: No    Attends meetings of clubs or organizations: Never    Relationship status: Divorced  Other Topics Concern  . Not on file  Social History Narrative   Significant other at bedside   Family History  Problem  Relation Age of Onset  . Hypertension Mother   . Diabetes Father   . Diabetes Sister    O: Component Value Date/Time   CHOL 201 (H) 04/14/2016 1321   HDL 34 (L) 04/14/2016 1321   LDLCALC 131 (H) 04/14/2016 1321   TRIG 178 (H) 04/14/2016 1321   GLUCOSE 154 (H) 07/06/2017 0508   HGBA1C 6.9 (H) 07/05/2017 0623   NA 136 07/06/2017 0508   NA 138 04/21/2016 1510   K 3.8 07/06/2017 0508   CL 103 07/06/2017 0508   CO2 22 07/06/2017 0508   BUN 13 07/06/2017 0508   BUN 14 04/21/2016 1510   CREATININE 1.56 (H) 07/06/2017 0508   CALCIUM 9.2 07/06/2017 0508   GFRNONAA 50 (L) 07/06/2017 0508   GFRAA 58 (L) 07/06/2017 0508   AST 24 07/04/2017 1118   ALT 17 07/04/2017 1118   WBC 5.8 07/04/2017 1118   HGB 16.1 07/04/2017 1118   HCT 45.9 07/04/2017 1118   PLT 202 07/04/2017 1118   Ht Readings from Last 2 Encounters:  07/04/17 5\' 7"  (1.702 m)  04/14/16 5\' 7"  (1.702 m)   Wt Readings from Last 2 Encounters:  07/04/17 290 lb (131.5 kg)  07/04/17 290 lb 9.6 oz (131.8 kg)   There is no height or weight on file  to calculate BMI. BP Readings from Last 3 Encounters:  06/21/18 (!) 152/97  07/06/17 (!) 165/110  07/04/17 (!) 177/134   A/P: Patient reports to clinic for support with BP and medication management per medical director consult. Patient has not followed up in clinic for about 1 year, he discusses confusion with his orange card. He states that when it was active in 2017, it did not provide access to care. I encouraged patient to renew orange card to be able to re-establish care. He agreed and appointment was scheduled with financial counselor.  Patient did not bring medications to visit, challenging to complete full review. He is enrolled in [facility] pharmacy, so was able to confirm findings below with pharmacy. His pharmacy enrollment will expire in 1-2 months. Advised patient to re-enroll for continued medication access.  Hypertension ROS: no medication side effects noted and no  symptoms (denies dizziness, chest pain, shortness of breath, edema, or other concerns), patient is alert, appears well, and is in no distress. He reports only taking amlodipine and lisinopril for HTN, not taking metoprolol. I confirmed with pharmacy patient has not filled metoprolol in the past year. BP remains elevated, initiated HCTZ 12.5 mg, education provided and will follow up in 1-2 weeks.  For DM, he is taking metformin and regular insulin 70/30, 5 units BID. Patient does not check BG at home due to not having a BG meter. Due to low insulin requirement (last A1C 6.9, fasting POC BG in clinic today was 75), switched to sitagliptin (can consider re-initiating insulin in the future if needed). Provided home BG meter, Contour next, and provided education, sent prescription for supplies. Patient does report working on healthy lifestyle. He is interested in working with dietician in the future, will send referral.  Otherwise, we worked on regimen simplification from 6 agents down to 3, patient advised to bring in all medications to follow up visit next week to help with this transition and he verbalized understanding by repeat back.  An after visit summary was provided and patient advised to follow up in 1 week or sooner if any changes in condition or questions regarding medications arise. The patient verbalized understanding of information provided by repeating back concepts discussed.

## 2018-06-21 NOTE — Addendum Note (Signed)
Addended by: Mliss FritzKIM, JENNIFER J on: 06/21/2018 03:51 PM   Modules accepted: Orders

## 2018-06-25 ENCOUNTER — Ambulatory Visit: Payer: Self-pay

## 2018-06-28 ENCOUNTER — Ambulatory Visit: Payer: Self-pay | Admitting: Pharmacist

## 2018-06-28 DIAGNOSIS — I10 Essential (primary) hypertension: Secondary | ICD-10-CM

## 2018-06-29 NOTE — Progress Notes (Signed)
S: Joshua Petty is a 52 y.o. male reports to clinical pharmacist appointment for help with medication management.  No Known Allergies Medication Sig  amLODipine-atorvastatin (CADUET) 10-40 MG tablet Take 1 tablet by mouth daily.  diclofenac sodium (VOLTAREN) 1 % GEL Apply 2 g topically 4 (four) times daily.  lisinopril-hydrochlorothiazide (PRINZIDE,ZESTORETIC) 20-12.5 MG tablet Take 1 tablet by mouth daily.  SitaGLIPtin-MetFORMIN HCl (JANUMET XR) 50-1000 MG TB24 Take 1 tablet by mouth 2 (two) times daily with a meal.   Past Medical History:  Diagnosis Date  . Diabetes (HCC)   . Gonorrhea   . Hyperlipidemia   . Hypertension    Social History   Socioeconomic History  . Marital status: Single    Spouse name: Not on file  . Number of children: Not on file  . Years of education: Not on file  . Highest education level: 12th grade  Occupational History  . Occupation: Software engineerhvac    Employer: Production assistant, [employer]  Social Needs  . Financial resource strain: Not very hard  . Food insecurity:    Worry: Never true    Inability: Never true  . Transportation needs:    Medical: No    Non-medical: No  Tobacco Use  . Smoking status: Never Smoker  . Smokeless tobacco: Never Used  Substance and Sexual Activity  . Alcohol use: No  . Drug use: Yes    Types: Marijuana    Comment: 3 days/week  . Sexual activity: Yes    Partners: Female    Birth control/protection: None  Lifestyle  . Physical activity:    Days per week: 2 days    Minutes per session: 30 min  . Stress: Not at all  Relationships  . Social connections:    Talks on phone: Once a week    Gets together: Once a week    Attends religious service: Never    Active member of club or organization: No    Attends meetings of clubs or organizations: Never    Relationship status: Divorced  Other Topics Concern  . Not on file  Social History Narrative   Significant other at bedside   Family History  Problem Relation  Age of Onset  . Hypertension Mother   . Diabetes Father   . Diabetes Sister    O: Component Value Date/Time   CHOL 201 (H) 04/14/2016 1321   HDL 34 (L) 04/14/2016 1321   LDLCALC 131 (H) 04/14/2016 1321   TRIG 178 (H) 04/14/2016 1321   GLUCOSE 154 (H) 07/06/2017 0508   HGBA1C 6.9 (H) 07/05/2017 0623   NA 136 07/06/2017 0508   NA 138 04/21/2016 1510   K 3.8 07/06/2017 0508   CL 103 07/06/2017 0508   CO2 22 07/06/2017 0508   BUN 13 07/06/2017 0508   BUN 14 04/21/2016 1510   CREATININE 1.56 (H) 07/06/2017 0508   CALCIUM 9.2 07/06/2017 0508   GFRNONAA 50 (L) 07/06/2017 0508   GFRAA 58 (L) 07/06/2017 0508   AST 24 07/04/2017 1118   ALT 17 07/04/2017 1118   WBC 5.8 07/04/2017 1118   HGB 16.1 07/04/2017 1118   HCT 45.9 07/04/2017 1118   PLT 202 07/04/2017 1118   Ht Readings from Last 2 Encounters:  07/04/17 5\' 7"  (1.702 m)  04/14/16 5\' 7"  (1.702 m)   Wt Readings from Last 2 Encounters:  07/04/17 290 lb (131.5 kg)  07/04/17 290 lb 9.6 oz (131.8 kg)   There is no height or weight on file  to calculate BMI. BP Readings from Last 3 Encounters:  06/21/18 (!) 152/97  07/06/17 (!) 165/110  07/04/17 (!) 177/134   A/P: Patient reports for follow up medication management, however did not bring medications to visit and has not taken his medications. He also has not scheduled an appointment with financial counselor which will be critical for follow up and monitoring response to therapy. Referred patient to financial counsel and re-scheduled visit (07/02/18). Advised patient to bring all medication bottles to next appointment.   The patient verbalized understanding of information provided by repeating back concepts discussed.

## 2018-07-02 ENCOUNTER — Ambulatory Visit: Payer: Self-pay

## 2018-07-02 ENCOUNTER — Ambulatory Visit: Payer: Self-pay | Admitting: Pharmacist

## 2018-07-02 DIAGNOSIS — I10 Essential (primary) hypertension: Secondary | ICD-10-CM

## 2018-07-03 NOTE — Progress Notes (Signed)
The visit was limited due to patient not bringing in medications from home. He reports not yet receiving his medications from patient assistance pharmacy. Contacted the pharmacy and they state patient should be receiving medications today. Advised patient to reschedule with me once he has the medications, he verbalized understanding. Patient was also referred to financial counselor today to renew his orange card---he was successful in meeting [employer] [facility] Willis.

## 2018-07-05 ENCOUNTER — Ambulatory Visit: Payer: Self-pay | Admitting: Pharmacist

## 2018-07-24 ENCOUNTER — Ambulatory Visit: Payer: Self-pay | Admitting: Pharmacist

## 2018-11-01 ENCOUNTER — Other Ambulatory Visit: Payer: Self-pay | Admitting: Internal Medicine

## 2018-11-01 DIAGNOSIS — I161 Hypertensive emergency: Secondary | ICD-10-CM

## 2018-11-01 DIAGNOSIS — I10 Essential (primary) hypertension: Secondary | ICD-10-CM

## 2018-11-01 DIAGNOSIS — E1100 Type 2 diabetes mellitus with hyperosmolarity without nonketotic hyperglycemic-hyperosmolar coma (NKHHC): Secondary | ICD-10-CM

## 2018-11-02 MED ORDER — LISINOPRIL-HYDROCHLOROTHIAZIDE 20-12.5 MG PO TABS: 1.0000 | ORAL_TABLET | Freq: Every day | ORAL | 0 refills | Status: DC

## 2018-11-02 MED ORDER — SITAGLIP PHOS-METFORMIN HCL ER 50-1000 MG PO TB24: 1.0000 | ORAL_TABLET | Freq: Two times a day (BID) | ORAL | 0 refills | Status: DC

## 2018-11-06 ENCOUNTER — Telehealth: Payer: Self-pay | Admitting: *Deleted

## 2018-11-06 ENCOUNTER — Other Ambulatory Visit: Payer: Self-pay | Admitting: Internal Medicine

## 2018-11-06 DIAGNOSIS — E1100 Type 2 diabetes mellitus with hyperosmolarity without nonketotic hyperglycemic-hyperosmolar coma (NKHHC): Secondary | ICD-10-CM

## 2018-11-06 NOTE — Telephone Encounter (Signed)
Call from pt - wanting to know if his meds had been refilled; including insulin;informed pt he's on Janumet which was refilled on 4/17 also Zestoretic; sent to [facility] pharmacy. He stated Janumet made he feel worse;asked if he had spoken to his doctor about this - stated no. He has several "no show" appts. Explained tele health appt tp pt - he's agreeable; scheduled for 4/22 in [facility] to discuss his medications.

## 2018-11-07 ENCOUNTER — Other Ambulatory Visit: Payer: Self-pay

## 2018-11-07 ENCOUNTER — Ambulatory Visit (INDEPENDENT_AMBULATORY_CARE_PROVIDER_SITE_OTHER): Payer: Self-pay | Admitting: Internal Medicine

## 2018-11-07 DIAGNOSIS — E118 Type 2 diabetes mellitus with unspecified complications: Secondary | ICD-10-CM

## 2018-11-07 DIAGNOSIS — E1100 Type 2 diabetes mellitus with hyperosmolarity without nonketotic hyperglycemic-hyperosmolar coma (NKHHC): Secondary | ICD-10-CM

## 2018-11-07 DIAGNOSIS — I10 Essential (primary) hypertension: Secondary | ICD-10-CM

## 2018-11-07 MED ORDER — METFORMIN HCL 1000 MG PO TABS: 1000.0000 mg | ORAL_TABLET | Freq: Two times a day (BID) | ORAL | 2 refills | Status: DC

## 2018-11-07 MED ORDER — AMLODIPINE-ATORVASTATIN 10-40 MG PO TABS: 1.0000 | ORAL_TABLET | Freq: Every day | ORAL | 3 refills | Status: DC

## 2018-11-07 MED ORDER — SITAGLIPTIN PHOSPHATE 100 MG PO TABS: 100.0000 mg | ORAL_TABLET | Freq: Every day | ORAL | 2 refills | Status: DC

## 2018-11-07 MED ORDER — LISINOPRIL-HYDROCHLOROTHIAZIDE 20-12.5 MG PO TABS: 1.0000 | ORAL_TABLET | Freq: Every day | ORAL | 2 refills | Status: DC

## 2018-11-07 NOTE — Progress Notes (Signed)
Medicine attending: Medical history, presenting problems,  and medications, reviewed with resident physician Dr Justin Helberg on the day of the patient telephone consultation and I concur with his evaluation and management plan. 

## 2018-11-07 NOTE — Assessment & Plan Note (Signed)
HPI:  Patient called to discuss his diabetes medications. He was initially diagnosed three years ago when he was hospitalized for hyperglycemia and his A1c was found to be greater than 15. Over the next couple years he has changed his diet and started to exercise a significant amount. His A1c is now 6.9. He does not check his CBG's regularly. He is currently on combination sitagliptin and metformin. He states that these medications were were previously individual and he was tolerating them well however since starting the combination pill his noted constipation. He is calling to ask that they be split back in individual pills.  A/P: - Constipation is unlikely to be related to the combination but will send out individual pills of Metformin 1000 mg BID and Sitagliptin 100 mg QD

## 2018-11-07 NOTE — Progress Notes (Signed)
   CC: Diabetes  This is a telephone encounter between Meredeth Ide and Levora Dredge on 11/07/2018 for diabetes. The visit was conducted with the patient located at home and [facility]. The patient's identity was confirmed using their DOB and current address. The patient has consented to being evaluated through a telephone encounter and understands the associated risks (an examination cannot be done and the patient may need to come in for an appointment) / benefits (allows the patient to remain at home, decreasing exposure to coronavirus). I personally spent 10 minutes on medical discussion.   HPI:  Mr.Gilad Petite is a 53 y.o. with PMH as below.   Please see A&P for assessment of the patient's acute and chronic medical conditions.   Past Medical History:  Diagnosis Date  . Diabetes (HCC)   . Gonorrhea   . Hyperlipidemia   . Hypertension    Review of Systems:  Performed and all others negative.  Assessment & Plan:   See Encounters Tab for problem based charting.  Patient discussed with Dr. Cyndie Chime

## 2018-11-08 ENCOUNTER — Telehealth: Payer: Self-pay | Admitting: *Deleted

## 2018-11-08 DIAGNOSIS — E118 Type 2 diabetes mellitus with unspecified complications: Secondary | ICD-10-CM

## 2018-11-08 DIAGNOSIS — I10 Essential (primary) hypertension: Secondary | ICD-10-CM

## 2018-11-08 DIAGNOSIS — E1100 Type 2 diabetes mellitus with hyperosmolarity without nonketotic hyperglycemic-hyperosmolar coma (NKHHC): Secondary | ICD-10-CM

## 2018-11-08 MED ORDER — METFORMIN HCL 1000 MG PO TABS: 1000.0000 mg | ORAL_TABLET | Freq: Two times a day (BID) | ORAL | 2 refills | Status: DC

## 2018-11-08 MED ORDER — LISINOPRIL-HYDROCHLOROTHIAZIDE 20-12.5 MG PO TABS: 1.0000 | ORAL_TABLET | Freq: Every day | ORAL | 2 refills | Status: DC

## 2018-11-08 MED ORDER — SITAGLIPTIN PHOSPHATE 100 MG PO TABS: 100.0000 mg | ORAL_TABLET | Freq: Every day | ORAL | 2 refills | Status: DC

## 2018-11-08 MED ORDER — AMLODIPINE-ATORVASTATIN 10-40 MG PO TABS: 1.0000 | ORAL_TABLET | Freq: Every day | ORAL | 3 refills | Status: DC

## 2018-11-08 NOTE — Telephone Encounter (Signed)
I called [place] [facility] pharmacy and confirmed patient is still active until the end of the year. Transferred prescriptions from [facility] to [facility] pharmacy and notified patient. Advised patient to contact clinic if any concerns arise.

## 2018-11-08 NOTE — Telephone Encounter (Addendum)
Fax from [employer] - stated pt does not have insurance and Caduet and Januvia are very expensive. Please change to something else. I will also send this to Amada Kingfisher to see if she cn help. Thanks

## 2018-11-29 ENCOUNTER — Encounter: Payer: Self-pay | Admitting: Internal Medicine

## 2019-08-25 NOTE — Progress Notes (Signed)
CC: Diabetes, hypertension, hyperlipidemia, testicular mass  HPI:  JoshuaJoshua Petty is a 54 y.o.  with a PMH listed below presenting for diabetes, hypertension, and hyperlipidemia.    Please see A&P for status of the patient's chronic medical conditions  Past Medical History:  Diagnosis Date  . Diabetes ([place])   . Gonorrhea   . Hyperlipidemia   . Hypertension    Review of Systems: Refer to history of present illness and assessment and plans for pertinent review of systems, all others reviewed and negative.  Physical Exam:  Vitals:   08/26/19 1325  BP: (!) 134/96  Pulse: 89  Temp: 98.7 F (37.1 C)  SpO2: 100%  Weight: 279 lb 6.4 oz (126.7 kg)  Height: 5\' 7"  (1.702 m)   Physical Exam  Constitutional: He is oriented to person, place, and time.  Obese male, NAD, sitting in chair  Cardiovascular: Normal rate, regular rhythm and normal heart sounds.  Pulmonary/Chest: Effort normal and breath sounds normal. No respiratory distress.  Abdominal: Soft. Bowel sounds are normal. He exhibits no distension.  Genitourinary:    Penis normal.     Genitourinary Comments: No masses in scrotal area noted   Neurological: He is alert and oriented to person, place, and time.  Skin: Skin is warm and dry.  Psychiatric: Mood and affect normal.   Social History   Socioeconomic History  . Marital status: Single    Spouse name: Not on file  . Number of children: Not on file  . Years of education: Not on file  . Highest education level: 12th grade  Occupational History  . Occupation: Multimedia programmer: Building [employer]  Tobacco Use  . Smoking status: Never Smoker  . Smokeless tobacco: Never Used  Substance and Sexual Activity  . Alcohol use: No  . Drug use: Yes    Types: Marijuana    Comment: 3 days/week  . Sexual activity: Yes    Partners: Female    Birth control/protection: None  Other Topics Concern  . Not on file  Social History Narrative   Significant  other at bedside   Social Determinants of Health   Financial Resource Strain:   . Difficulty of Paying Living Expenses: Not on file  Food Insecurity:   . Worried About Charity fundraiser in the Last Year: Not on file  . Ran Out of Food in the Last Year: Not on file  Transportation Needs:   . Lack of Transportation (Medical): Not on file  . Lack of Transportation (Non-Medical): Not on file  Physical Activity:   . Days of Exercise per Week: Not on file  . Minutes of Exercise per Session: Not on file  Stress:   . Feeling of Stress : Not on file  Social Connections:   . Frequency of Communication with Friends and Family: Not on file  . Frequency of Social Gatherings with Friends and Family: Not on file  . Attends Religious Services: Not on file  . Active Member of Clubs or Organizations: Not on file  . Attends Archivist Meetings: Not on file  . Marital Status: Not on file  Intimate Partner Violence:   . Fear of Current or Ex-Partner: Not on file  . Emotionally Abused: Not on file  . Physically Abused: Not on file  . Sexually Abused: Not on file   Family History  Problem Relation Age of Onset  . Hypertension Mother   . Diabetes Father   .  Diabetes Sister     Assessment & Plan:   See Encounters Tab for problem based charting.  Patient discussed with Dr. Sandre Kitty

## 2019-08-26 ENCOUNTER — Ambulatory Visit: Payer: Self-pay | Admitting: Internal Medicine

## 2019-08-26 VITALS — BP 134/96 | HR 89 | Temp 98.7°F | Ht 67.0 in | Wt 279.4 lb

## 2019-08-26 DIAGNOSIS — Z0000 Encounter for general adult medical examination without abnormal findings: Secondary | ICD-10-CM

## 2019-08-26 DIAGNOSIS — N5089 Other specified disorders of the male genital organs: Secondary | ICD-10-CM

## 2019-08-26 DIAGNOSIS — Z794 Long term (current) use of insulin: Secondary | ICD-10-CM

## 2019-08-26 DIAGNOSIS — Z79899 Other long term (current) drug therapy: Secondary | ICD-10-CM

## 2019-08-26 DIAGNOSIS — E118 Type 2 diabetes mellitus with unspecified complications: Secondary | ICD-10-CM

## 2019-08-26 DIAGNOSIS — E1100 Type 2 diabetes mellitus with hyperosmolarity without nonketotic hyperglycemic-hyperosmolar coma (NKHHC): Secondary | ICD-10-CM

## 2019-08-26 DIAGNOSIS — E785 Hyperlipidemia, unspecified: Secondary | ICD-10-CM

## 2019-08-26 DIAGNOSIS — I10 Essential (primary) hypertension: Secondary | ICD-10-CM

## 2019-08-26 LAB — POCT GLYCOSYLATED HEMOGLOBIN (HGB A1C): Hemoglobin A1C: 6.8 % — AB (ref 4.0–5.6)

## 2019-08-26 LAB — GLUCOSE, CAPILLARY: Glucose-Capillary: 150 mg/dL — ABNORMAL HIGH (ref 70–99)

## 2019-08-26 MED ORDER — ATORVASTATIN CALCIUM 40 MG PO TABS: 40.0000 mg | ORAL_TABLET | Freq: Every day | ORAL | 3 refills | Status: DC

## 2019-08-26 MED ORDER — NOVOLIN 70/30 (70-30) 100 UNIT/ML ~~LOC~~ SUSP: 5.0000 [IU] | Freq: Every day | SUBCUTANEOUS | 5 refills | Status: DC

## 2019-08-26 NOTE — Patient Instructions (Addendum)
[name],  It was a pleasure to see you today. Thank you for coming in.   Today we discussed your diabetes, high cholesterol and blood pressure.  Please continue taking the lisinopril 40 mg daily.  Please start taking atorvastatin 40 mg daily for your cholesterol.   Your diabetes is doing well today. Your A1c was 6.8. Please continue taking the insulin 70/30 5 units daily, metformin 1000 mg twice a day, and continue checking your blood sugars at home.   I have checked some labs today, I will call you if they are abnormal.   Please return to clinic in 6 months or sooner if needed.   Thank you again for coming in.   [name]

## 2019-08-27 LAB — LIPID PANEL
Chol/HDL Ratio: 5.5 ratio — ABNORMAL HIGH (ref 0.0–5.0)
Cholesterol, Total: 203 mg/dL — ABNORMAL HIGH (ref 100–199)
HDL: 37 mg/dL — ABNORMAL LOW (ref >39)
LDL Chol Calc (NIH): 78 mg/dL (ref 0–99)
Triglycerides: 557 mg/dL (ref 0–149)
VLDL Cholesterol Cal: 88 mg/dL — ABNORMAL HIGH (ref 5–40)

## 2019-08-27 LAB — BMP8+ANION GAP
Anion Gap: 14 mmol/L (ref 10.0–18.0)
BUN/Creatinine Ratio: 14 (ref 9–20)
BUN: 23 mg/dL (ref 6–24)
CO2: 27 mmol/L (ref 20–29)
Calcium: 10.4 mg/dL — ABNORMAL HIGH (ref 8.7–10.2)
Chloride: 99 mmol/L (ref 96–106)
Creatinine, Ser: 1.69 mg/dL — ABNORMAL HIGH (ref 0.76–1.27)
GFR calc Af Amer: 52 mL/min/{1.73_m2} — ABNORMAL LOW (ref >59)
GFR calc non Af Amer: 45 mL/min/{1.73_m2} — ABNORMAL LOW (ref >59)
Glucose: 107 mg/dL — ABNORMAL HIGH (ref 65–99)
Potassium: 4.3 mmol/L (ref 3.5–5.2)
Sodium: 140 mmol/L (ref 134–144)

## 2019-08-27 NOTE — Assessment & Plan Note (Signed)
Patient is taking lisinopril-HCTZ 20-12.5 mg daily at home. Denies any issues taking his medications. Denies any light headedness, dizziness, fatigue, weakness, etc. BP today is 134/96.  -Continue lisinopril-hctz 20-12.5 mg daily -Repeat BMP today

## 2019-08-28 NOTE — Progress Notes (Signed)
Internal Medicine Clinic Attending  Case discussed with Dr. Krienke at the time of the visit.  We reviewed the resident's history and exam and pertinent patient test results.  I agree with the assessment, diagnosis, and plan of care documented in the resident's note.  Juno Fredonia Casalino, M.D., Ph.D.  

## 2019-08-29 DIAGNOSIS — N5089 Other specified disorders of the male genital organs: Secondary | ICD-10-CM | POA: Insufficient documentation

## 2019-08-29 NOTE — Assessment & Plan Note (Signed)
Patient reports that he is currently taking insulin 70/30 5 units daily and metformin 1000 mg BID. He had been on sitagliptin in the past however reports that he started having side effects and GI issues with this medication and stopped it. He reports that he checks his blood sugars about 2 times per week, around 120-150 when he checks it. Reports that he does drink a lot of water, up to a gallon per day, denies any increased thirst or increased urination. Reports that he urinates about 2 times per night. Last A1c was 2018 6.9, today it is 6.8.  -Continue insulin 70/30 5 units daily, adjust in chart -Continue metformin 1000 mg BID -Repeat A1c in 3 months

## 2019-08-29 NOTE — Assessment & Plan Note (Signed)
Patient reports that he has a small bump on his left testicle, he reports that it showed up a few months ago. He reports that it's not painful, no discoloration, not getting bigger, and was soft. He reports that he never had this before. He reports he has a history of right sided hernia before but this was different. When starting the exam he reported that he was not able to feel the bump. On exam he did not have any testicular masses noted, no discoloration or rash noted.  Given the sudden improvement this may have been a small cyst that had developed and resolved.  Advised him to monitor for now and inform as if symptoms recur.

## 2019-09-04 IMAGING — MR MR HEAD W/O CM
9 of 10 series · 35 of 48 positions shown · non-contrast
Comparison: None.

CLINICAL DATA: LEFT vision loss, headache and severe hypertension
for 1 week. Assess for infarct or mass. History of diabetes,
hypertension and hyperlipidemia.

EXAM:
MRI HEAD WITHOUT CONTRAST
TECHNIQUE: Multiplanar, multiecho pulse sequences of the brain and surrounding
structures were obtained without intravenous contrast.

[Series 3: DWI · axial · 3.0mm · 0.94mm/px · z∈[-68,+64]mm · 8 of 90 slices shown (1 of 2)]
[im 1/90]
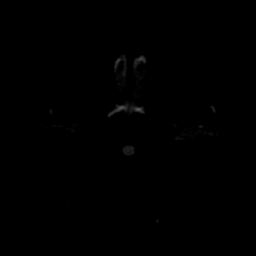
[im 10/90]
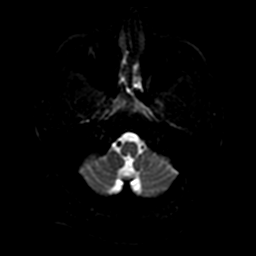
[im 30/90]
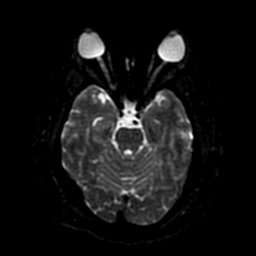
[im 40/90]
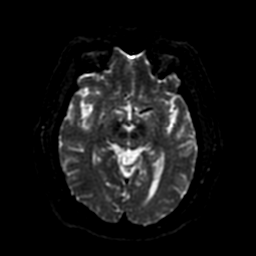
[im 50/90]
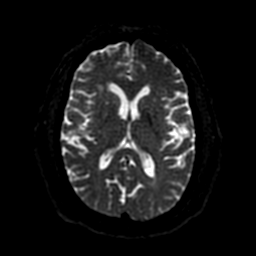
[im 60/90]
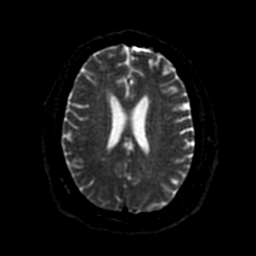
[im 80/90]
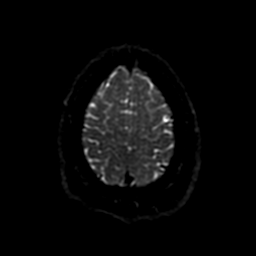
[im 90/90]
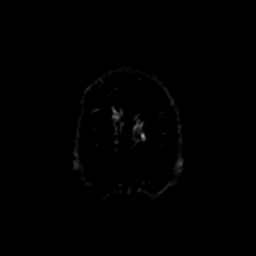

[Series 4: DWI · coronal · 4.0mm · 0.94mm/px · 7 of 64 slices shown (2 of 2)]
[im 1/64]
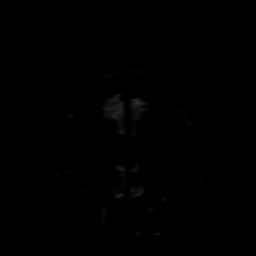
[im 11/64]
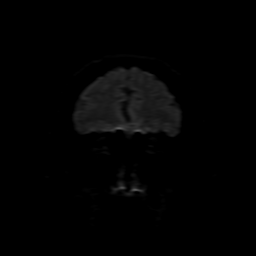
[im 22/64]
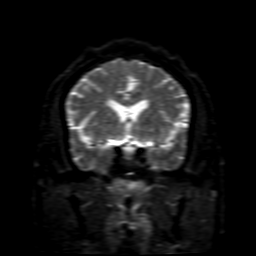
[im 32/64]
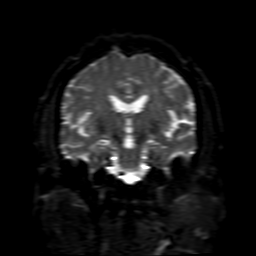
[im 43/64]
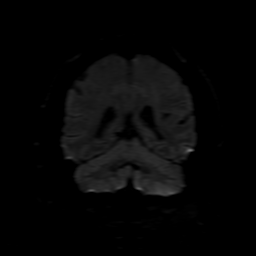
[im 53/64]
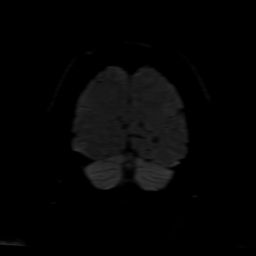
[im 64/64]
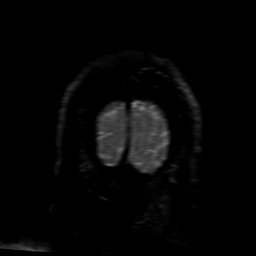

[Series 5: FLAIR · sagittal · 5.0mm · 0.47mm/px · 2 of 23 slices shown (1 of 2)]
[im 1/23]
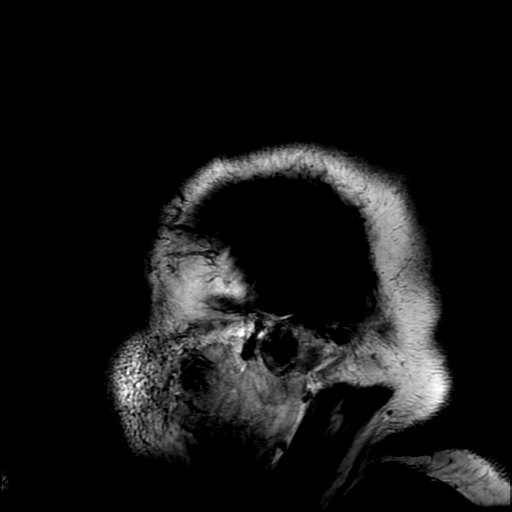
[im 23/23]
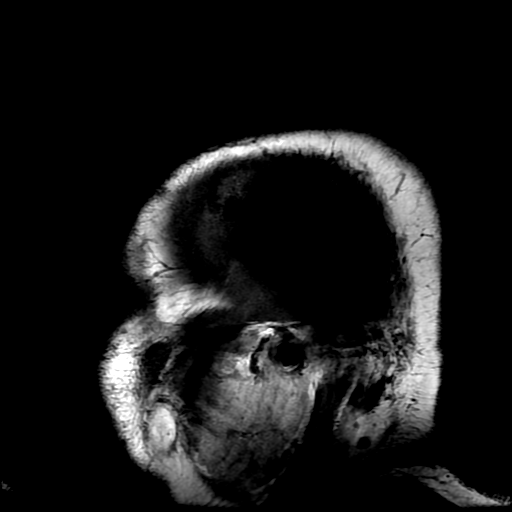

[Series 6: T2 · axial · 5.0mm · 0.47mm/px · z∈[-68,+64]mm · 2 of 23 slices shown (1 of 2)]
[im 1/23]
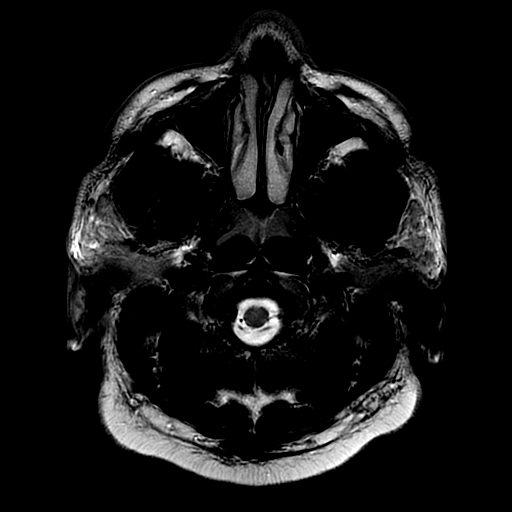
[im 23/23]
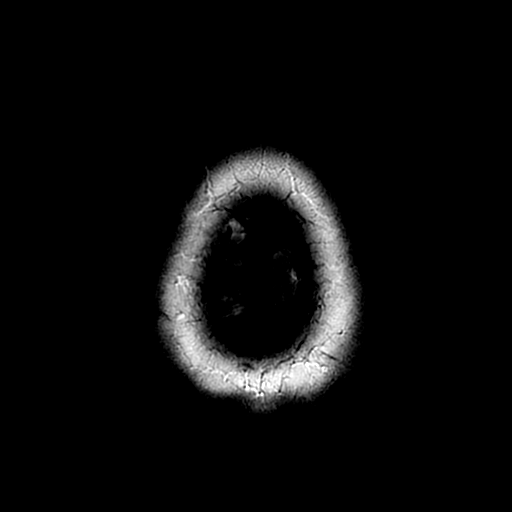

[Series 7: FLAIR · axial · 3.0mm · 0.41mm/px · z∈[-68,+64]mm · 2 of 23 slices shown (2 of 2)]
[im 1/23]
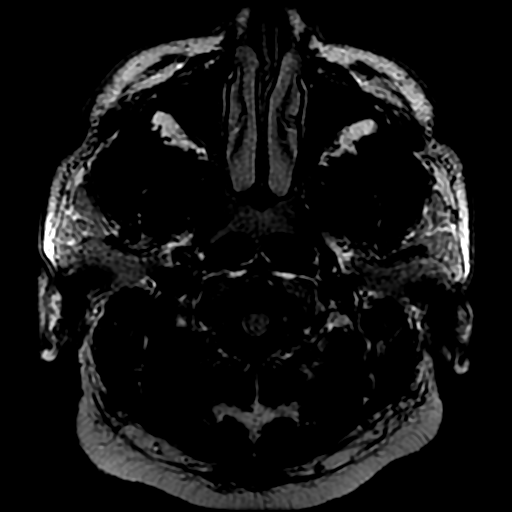
[im 23/23]
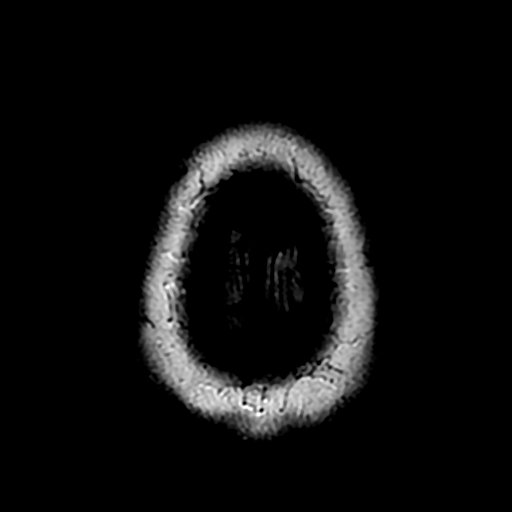

[Series 8: (person_name) · axial · 3.0mm · 0.47mm/px · z∈[-70,-37]mm · 3 of 92 slices shown]
[im 1/92]
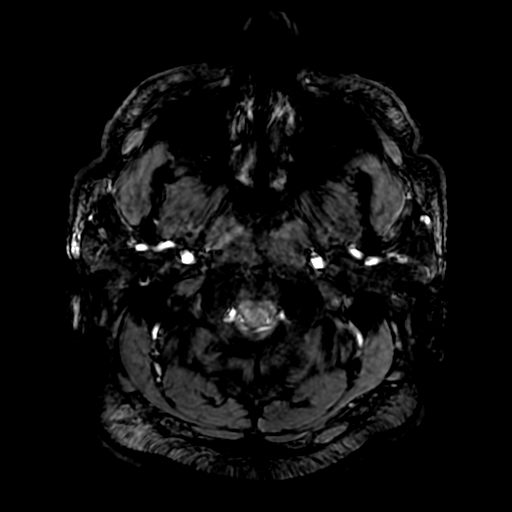
[im 12/92]
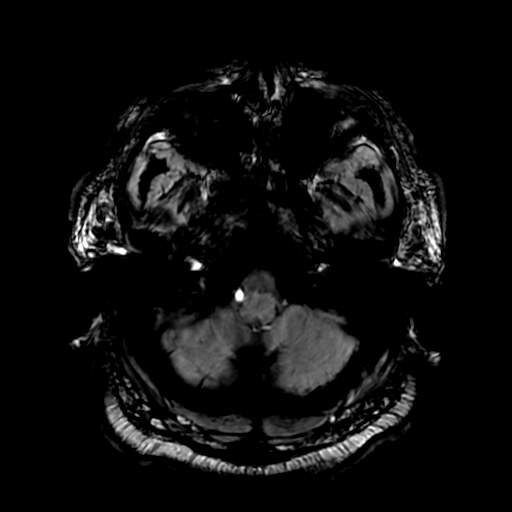
[im 23/92]
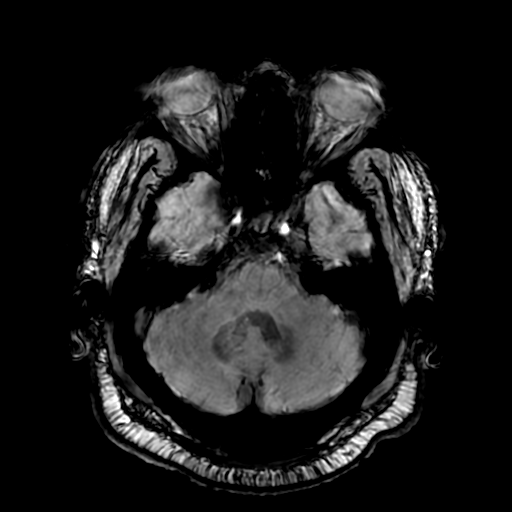

[Series 11: T2 · coronal · 5.0mm · 0.39mm/px · 3 of 27 slices shown (2 of 2)]
[im 1/27]
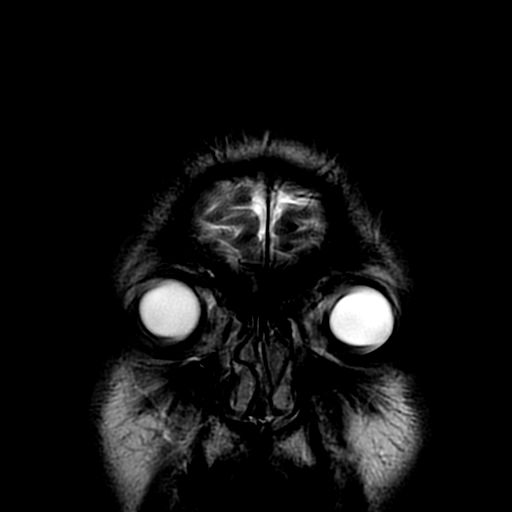
[im 14/27]
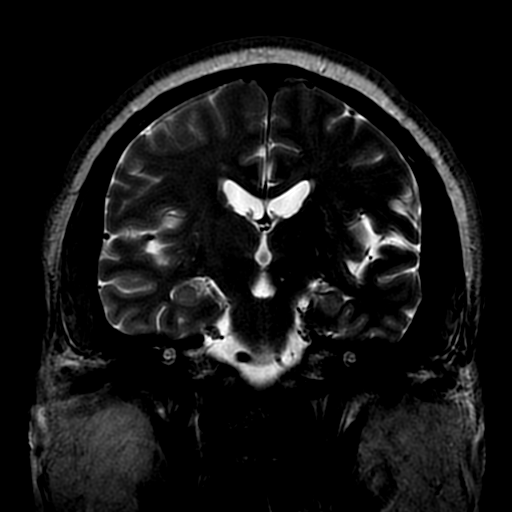
[im 27/27]
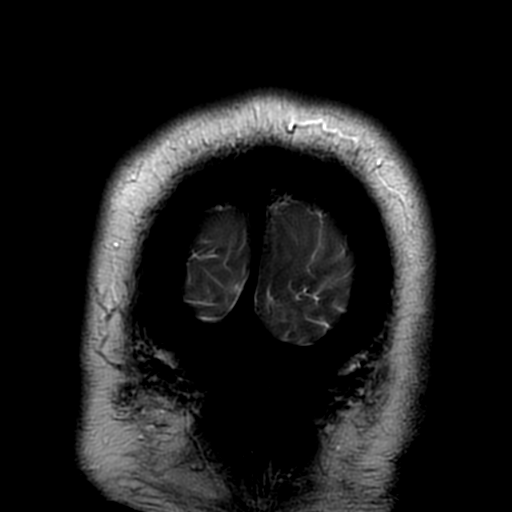

[Series 350: ADC · axial · 3.0mm · 0.94mm/px · z∈[-68,+64]mm · 5 of 45 slices shown (1 of 2)]
[im 1/45]
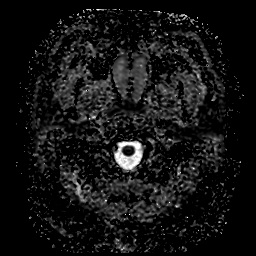
[im 12/45]
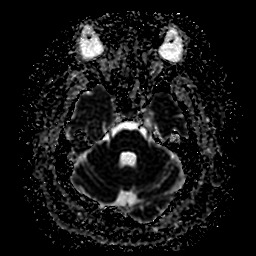
[im 23/45]
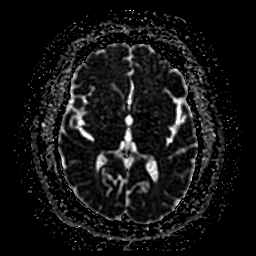
[im 34/45]
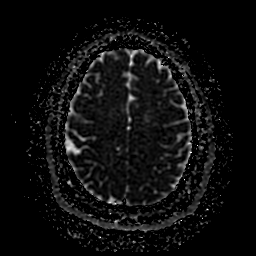
[im 45/45]
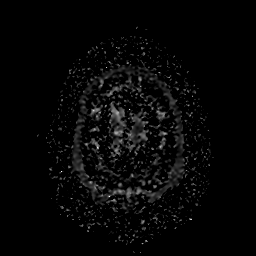

[Series 450: ADC · coronal · 4.0mm · 0.94mm/px · 3 of 32 slices shown (2 of 2)]
[im 1/32]
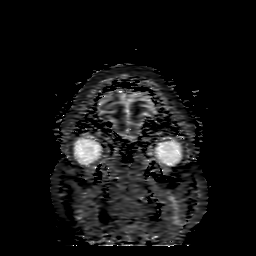
[im 16/32]
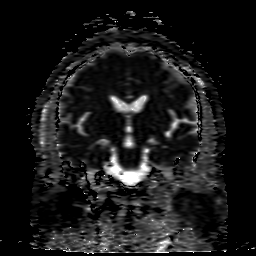
[im 32/32]
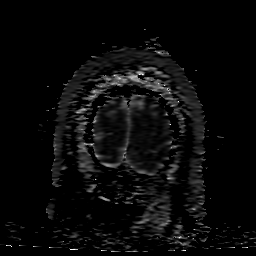

[35 of 48 positions shown; findings below may reference images not displayed]

FINDINGS: BRAIN: No reduced diffusion to suggest acute ischemia. No
susceptibility artifact to suggest hemorrhage. The ventricles and
sulci are upper limits of normal for patient's age. Faint T2
hyperintense signal RIGHT caudate head without volume loss. Patchy
supratentorial white matter FLAIR T2 hyperintensities. No suspicious
parenchymal signal, mass or mass effect. No abnormal extra-axial
fluid collections.

VASCULAR: Major intracranial vascular flow voids maintained at skull
base with mild dolichoectasia associated with chronic hypertension.

SKULL AND UPPER CERVICAL SPINE: No abnormal sellar expansion. No
suspicious calvarial bone marrow signal. Craniocervical junction
maintained.

SINUSES/ORBITS: Severe chronic LEFT sphenoid sinusitis, mild general
paranasal sinus mucosal thickening. The included ocular globes and
orbital contents are non-suspicious.

OTHER: None.
IMPRESSION: 1. No acute intracranial process.
2. Mild white matter changes compatible with chronic small vessel
ischemic disease.
3. RIGHT basal ganglia faint signal abnormality favoring old
ischemia, less likely mass. Recommend follow-up post gadolinium
sequences versus repeat MRI of the head in 3-6 months.

## 2019-09-17 ENCOUNTER — Other Ambulatory Visit: Payer: Self-pay

## 2019-09-17 DIAGNOSIS — Z0000 Encounter for general adult medical examination without abnormal findings: Secondary | ICD-10-CM

## 2019-09-18 LAB — FECAL OCCULT BLOOD, IMMUNOCHEMICAL: Fecal Occult Bld: NEGATIVE

## 2019-09-21 ENCOUNTER — Other Ambulatory Visit: Payer: Self-pay | Admitting: Internal Medicine

## 2019-09-21 DIAGNOSIS — I10 Essential (primary) hypertension: Secondary | ICD-10-CM

## 2019-09-24 NOTE — Telephone Encounter (Signed)
lisinopril-hydrochlorothiazide (ZESTORETIC) 20-12.5 MG tablet, refill request @  [facility] [address], [place] - [address] [phone] (Phone) [phone] (Fax)

## 2020-03-02 ENCOUNTER — Encounter: Payer: Self-pay | Admitting: Internal Medicine

## 2020-03-02 ENCOUNTER — Ambulatory Visit: Payer: Self-pay | Admitting: Internal Medicine

## 2020-03-02 ENCOUNTER — Other Ambulatory Visit: Payer: Self-pay

## 2020-03-02 VITALS — BP 148/110 | HR 76 | Temp 98.2°F | Ht 67.0 in | Wt 273.6 lb

## 2020-03-02 DIAGNOSIS — E1100 Type 2 diabetes mellitus with hyperosmolarity without nonketotic hyperglycemic-hyperosmolar coma (NKHHC): Secondary | ICD-10-CM

## 2020-03-02 DIAGNOSIS — E1169 Type 2 diabetes mellitus with other specified complication: Secondary | ICD-10-CM

## 2020-03-02 DIAGNOSIS — E118 Type 2 diabetes mellitus with unspecified complications: Secondary | ICD-10-CM

## 2020-03-02 DIAGNOSIS — I10 Essential (primary) hypertension: Secondary | ICD-10-CM

## 2020-03-02 LAB — GLUCOSE, CAPILLARY: Glucose-Capillary: 97 mg/dL (ref 70–99)

## 2020-03-02 LAB — POCT GLYCOSYLATED HEMOGLOBIN (HGB A1C): Hemoglobin A1C: 6.6 % — AB (ref 4.0–5.6)

## 2020-03-02 MED ORDER — METFORMIN HCL 1000 MG PO TABS: 1000.0000 mg | ORAL_TABLET | Freq: Two times a day (BID) | ORAL | 3 refills | Status: DC

## 2020-03-02 NOTE — Patient Instructions (Addendum)
[name],  It was a pleasure to see you today. Thank you for coming in.   Today we discussed your diabetes. In regards to this, your A1c looks good today. It is 6.6. You can stop taking the insulin for now. Please continue taking the metformin 1000 mg twice a day. Please start checking your blood sugars daily. If your glucose if persistently >160 please contact us, we can start you on a different oral medication.    We also discussed your blood pressure. This was a little high today. Please work on [employer], try doing the DASH diet. Please start checking your blood pressure at home. If the top numbers are persistently >140 please contact us. We may start you on additional medications.   Please return to clinic in 3 months or sooner if needed.   Thank you again for coming in.   [name]

## 2020-03-02 NOTE — Progress Notes (Signed)
   CC: HTN, DM  HPI:  [name] is a 54 y.o. with the history listed below presenting for follow up of his diabetes and HTN.   Past Medical History:  Diagnosis Date  . Diabetes (HCC)   . Gonorrhea   . Hyperlipidemia   . Hypertension    Review of Systems:   Constitutional: Negative for chills and fever.  Respiratory: Negative for shortness of breath.   Cardiovascular: Negative for chest pain and leg swelling.  Gastrointestinal: Negative for abdominal pain, nausea and vomiting.  Neurological: Negative for dizziness and headaches.   Physical Exam:  Vitals:   03/02/20 1429  BP: (!) 146/112  Pulse: 85  Temp: 98.2 F (36.8 C)  TempSrc: Oral  SpO2: 99%  Weight: 273 lb 9.6 oz (124.1 kg)  Height: 5\' 7"  (1.702 m)   Physical Exam Constitutional:      Appearance: Normal appearance.  Cardiovascular:     Rate and Rhythm: Normal rate and regular rhythm.     Pulses: Normal pulses.     Heart sounds: Normal heart sounds.  Pulmonary:     Effort: Pulmonary effort is normal.     Breath sounds: Normal breath sounds.  Abdominal:     General: Abdomen is flat. Bowel sounds are normal.     Palpations: Abdomen is soft.  Skin:    General: Skin is warm and dry.     Capillary Refill: Capillary refill takes less than 2 seconds.  Neurological:     General: No focal deficit present.     Mental Status: He is alert and oriented to person, place, and time.  Psychiatric:        Mood and Affect: Mood normal.        Behavior: Behavior normal.      Assessment & Plan:   See Encounters Tab for problem based charting.  Patient discussed with Dr. 

## 2020-03-03 NOTE — Assessment & Plan Note (Signed)
Patient is currently on lisinopril-HCTZ 20-12.5 mg daily.  Denies any issues taking his medications, took his medications today.  He reports that his blood pressures at home are around 130/90-100.  He does report eating biscuits and fried chicken this morning.  Reports exercising about 3-4 times per week which includes cardio for 45 to 60 minutes.  Blood pressure today is elevated at 146/112, repeat was 148/110.  We discussed that he is still above goal, discussed that we can either increase his blood pressure medication or we can try to work on his diet.  Reported interest in working on his diet first.  Provided information on the DASH diet.  Provided ambulatory blood pressure monitor, advised to contact us if blood pressures consistently above 140.  -Continue lisinopril-HCTZ 20-12.5 mg daily -Advised to contact us if systolic blood pressure consistently above 140, may increase his lisinopril-HCTZ if this occurs -Provided information on DASH diet

## 2020-03-03 NOTE — Progress Notes (Signed)
Internal Medicine Clinic Attending ° °Case discussed with Dr. Krienke  At the time of the visit.  We reviewed the resident’s history and exam and pertinent patient test results.  I agree with the assessment, diagnosis, and plan of care documented in the resident’s note.  °

## 2020-03-03 NOTE — Assessment & Plan Note (Addendum)
Patient is currently on insulin 70/30 5 units daily and Metformin 1000 mg twice daily.  He reports his blood sugars at home around 115-120s.  He does report drinking about a gallon per day, he reports trying to stay well-hydrated, he does endorse some polyuria secondary to this.  His A1c today is well controlled at 6.6, previous A1c's were well controlled.  Given the low dose of insulin we discussed that he may not require the insulin.  He had previously been on sitagliptin in the past and reports that he had GI issues with this.  Had a discussion about adjusting his diabetic regimen, either stopping the insulin or stopping the insulin and transitioning to oral Janumet. He is agreeable to holding the insulin for now and repeat his A1c in 3 months. He has an eye exam scheduled.   -Stop insulin -Continue metformin 1000 mg twice daily -Advised to contact us if CBG is consistently >160 -RTC in 3 months, repeat A1c

## 2020-05-14 ENCOUNTER — Other Ambulatory Visit: Payer: Self-pay | Admitting: Internal Medicine

## 2020-05-14 DIAGNOSIS — I10 Essential (primary) hypertension: Secondary | ICD-10-CM

## 2020-05-25 ENCOUNTER — Other Ambulatory Visit: Payer: Self-pay | Admitting: *Deleted

## 2020-05-25 DIAGNOSIS — I10 Essential (primary) hypertension: Secondary | ICD-10-CM

## 2020-05-27 ENCOUNTER — Other Ambulatory Visit: Payer: Self-pay | Admitting: Internal Medicine

## 2020-05-27 DIAGNOSIS — I10 Essential (primary) hypertension: Secondary | ICD-10-CM

## 2020-05-28 ENCOUNTER — Ambulatory Visit: Payer: Self-pay | Admitting: Internal Medicine

## 2020-05-28 ENCOUNTER — Other Ambulatory Visit: Payer: Self-pay

## 2020-05-28 ENCOUNTER — Encounter: Payer: Self-pay | Admitting: Internal Medicine

## 2020-05-28 VITALS — BP 148/100 | HR 90 | Temp 97.9°F | Ht 67.0 in | Wt 273.6 lb

## 2020-05-28 DIAGNOSIS — E782 Mixed hyperlipidemia: Secondary | ICD-10-CM

## 2020-05-28 DIAGNOSIS — E1100 Type 2 diabetes mellitus with hyperosmolarity without nonketotic hyperglycemic-hyperosmolar coma (NKHHC): Secondary | ICD-10-CM

## 2020-05-28 DIAGNOSIS — E785 Hyperlipidemia, unspecified: Secondary | ICD-10-CM | POA: Insufficient documentation

## 2020-05-28 DIAGNOSIS — I10 Essential (primary) hypertension: Secondary | ICD-10-CM

## 2020-05-28 DIAGNOSIS — Z0000 Encounter for general adult medical examination without abnormal findings: Secondary | ICD-10-CM

## 2020-05-28 LAB — POCT GLYCOSYLATED HEMOGLOBIN (HGB A1C): Hemoglobin A1C: 6.9 % — AB (ref 4.0–5.6)

## 2020-05-28 LAB — GLUCOSE, CAPILLARY: Glucose-Capillary: 98 mg/dL (ref 70–99)

## 2020-05-28 MED ORDER — LISINOPRIL-HYDROCHLOROTHIAZIDE 20-12.5 MG PO TABS: 2.0000 | ORAL_TABLET | Freq: Every day | ORAL | 1 refills | Status: DC

## 2020-05-28 NOTE — Assessment & Plan Note (Addendum)
Patient has a history of hyperlipidemia and had previously been on Caduet, this medication was too expensive so he stopped taking this. Now on atorvastatin 40 mg daily. His lipid panel on 2/8 showed cholesterol 203, HDL 37, triglycerides 557, and LDL of 78.  His 10-year ASCVD risk is 26.5%, lifetime risk of 69%.    -Continue atorvastatin 40 mg daily -Repeat lipid panel in 1 year

## 2020-05-28 NOTE — Progress Notes (Signed)
Internal Medicine Clinic Attending ° °Case discussed with Dr. Krienke  At the time of the visit.  We reviewed the resident’s history and exam and pertinent patient test results.  I agree with the assessment, diagnosis, and plan of care documented in the resident’s note.  °

## 2020-05-28 NOTE — Assessment & Plan Note (Signed)
Patient is currently on lisinopril-HCTZ 20-12.5 mg daily. Has been out of his meds for 1 week. Blood pressure today is elevated at 159/104, repeat was 148/100. He states that he was checking his blood pressures at home when he was taking his medications and they were around 155-154/99-104. Denies any headaches, lightheadedness, dizziness, fatigue, weakness or other issues. Based on his reported home BP readings his BP is not at goal even while on the medication.   -Increase lisinopril-hctz to 40-25 mg daily -RTC in 1 month for repeat BP check

## 2020-05-28 NOTE — Progress Notes (Signed)
   CC: DM, HTN  HPI:  Mr.Angello Haydon is a 54 y.o. with a history listed below presenting for his hypertension and his diabetes.   Past Medical History:  Diagnosis Date  . Diabetes (HCC)   . Gonorrhea   . Hyperlipidemia   . Hypertension    Review of Systems:   Constitutional: Negative for chills and fever.  Respiratory: Negative for shortness of breath.   Cardiovascular: Negative for chest pain and leg swelling.  Gastrointestinal: Negative for abdominal pain, nausea and vomiting.  Neurological: Negative for dizziness and headaches.    Physical Exam:  Vitals:   05/28/20 1340 05/28/20 1408  BP: (!) 159/104 (!) 148/100  Pulse: 94 90  Temp: 97.9 F (36.6 C)   SpO2: 98%   Weight: 273 lb 9.6 oz (124.1 kg)   Height: 5\' 7"  (1.702 m)    Physical Exam HENT:     Head: Normocephalic and atraumatic.  Eyes:     Conjunctiva/sclera: Conjunctivae normal.     Pupils: Pupils are equal, round, and reactive to light.  Neck:     Thyroid: No thyromegaly.  Cardiovascular:     Rate and Rhythm: Normal rate and regular rhythm.     Pulses: Normal pulses.     Heart sounds: Normal heart sounds. No murmur heard.  No friction rub. No gallop.   Pulmonary:     Effort: Pulmonary effort is normal. No respiratory distress.     Breath sounds: Normal breath sounds. No wheezing.  Abdominal:     General: Bowel sounds are normal. There is no distension.     Palpations: Abdomen is soft.  Musculoskeletal:        General: Normal range of motion.     Cervical back: Normal range of motion and neck supple.  Skin:    General: Skin is warm and dry.     Capillary Refill: Capillary refill takes less than 2 seconds.     Findings: No erythema.  Neurological:     Mental Status: He is alert and oriented to person, place, and time.     Gait: Gait is intact.  Psychiatric:        Mood and Affect: Mood and affect normal.     Assessment & Plan:   See Encounters Tab for problem based charting.  Patient  discussed with Dr. 

## 2020-05-28 NOTE — Patient Instructions (Addendum)
[name],  It was a pleasure to see you today. Thank you for coming in.   Today we discussed your diabetes. In regards to this please continue taking the metformin 1000 mg twice a day. Your A1c looks good today. You can continue checking your blood sugars as needed.   We also discussed your blood pressure. This is elevated today. I have increased your Lisinopril-HCTZ to 40-25 mg daily. Please continue checking your blood pressures at home and bring in your readings on your next visit.    Please return to clinic in 1 month or sooner if needed.   Thank you again for coming in.   [name]

## 2020-05-28 NOTE — Assessment & Plan Note (Addendum)
Patient is currently on Metformin 1000 mg twice daily, his A1c on his last visit was 6.6 and his insulin was discontinued at that time.  Reports that he does check his blood sugars at home occasionally and they are around 90-100.  He does endorse some polyuria and polydipsia however states that he tries to drink a gallon of water per day. A1c today is 6.9. Well controlled, no changes at this time.   -Continue Metformin 1000 mg twice daily -Has eye appointment scheduled for tomorrow -Can repeat A1c in 6 months

## 2020-05-28 NOTE — Assessment & Plan Note (Signed)
Patient due to flu and PNA vaccine, declined both at this time.

## 2020-05-29 LAB — HM DIABETES EYE EXAM

## 2020-06-01 ENCOUNTER — Encounter: Payer: Self-pay | Admitting: Internal Medicine

## 2020-12-12 ENCOUNTER — Encounter: Payer: Self-pay | Admitting: *Deleted

## 2021-03-05 ENCOUNTER — Other Ambulatory Visit: Payer: Self-pay | Admitting: Internal Medicine

## 2021-03-05 DIAGNOSIS — E118 Type 2 diabetes mellitus with unspecified complications: Secondary | ICD-10-CM

## 2021-03-05 DIAGNOSIS — I10 Essential (primary) hypertension: Secondary | ICD-10-CM

## 2021-03-05 DIAGNOSIS — E785 Hyperlipidemia, unspecified: Secondary | ICD-10-CM

## 2021-03-30 MED ORDER — METFORMIN HCL 1000 MG PO TABS: 1000.0000 mg | ORAL_TABLET | Freq: Two times a day (BID) | ORAL | 3 refills | Status: DC

## 2021-03-30 MED ORDER — ATORVASTATIN CALCIUM 40 MG PO TABS: 40.0000 mg | ORAL_TABLET | Freq: Every day | ORAL | 3 refills | Status: DC

## 2021-05-03 ENCOUNTER — Encounter: Payer: Self-pay | Admitting: Internal Medicine

## 2021-05-03 ENCOUNTER — Telehealth: Payer: Self-pay | Admitting: *Deleted

## 2021-05-03 NOTE — Telephone Encounter (Signed)
Called and spoke with patient regarding his missed appointment. Rescheduled to  05-06-2021  @ 8:45 am.

## 2021-05-06 ENCOUNTER — Encounter: Payer: Self-pay | Admitting: Internal Medicine

## 2021-05-06 ENCOUNTER — Ambulatory Visit: Payer: Self-pay | Admitting: Internal Medicine

## 2021-05-06 ENCOUNTER — Other Ambulatory Visit: Payer: Self-pay

## 2021-05-06 ENCOUNTER — Other Ambulatory Visit (HOSPITAL_COMMUNITY): Payer: Self-pay

## 2021-05-06 VITALS — BP 129/87 | HR 95 | Temp 97.5°F | Ht 67.0 in | Wt 270.0 lb

## 2021-05-06 DIAGNOSIS — I10 Essential (primary) hypertension: Secondary | ICD-10-CM

## 2021-05-06 DIAGNOSIS — Z7984 Long term (current) use of oral hypoglycemic drugs: Secondary | ICD-10-CM

## 2021-05-06 DIAGNOSIS — E118 Type 2 diabetes mellitus with unspecified complications: Secondary | ICD-10-CM

## 2021-05-06 DIAGNOSIS — E1100 Type 2 diabetes mellitus with hyperosmolarity without nonketotic hyperglycemic-hyperosmolar coma (NKHHC): Secondary | ICD-10-CM

## 2021-05-06 LAB — GLUCOSE, CAPILLARY: Glucose-Capillary: 109 mg/dL — ABNORMAL HIGH (ref 70–99)

## 2021-05-06 LAB — POCT GLYCOSYLATED HEMOGLOBIN (HGB A1C): Hemoglobin A1C: 6.5 % — AB (ref 4.0–5.6)

## 2021-05-06 MED ORDER — LIRAGLUTIDE 18 MG/3ML ~~LOC~~ SOPN
PEN_INJECTOR | SUBCUTANEOUS | 3 refills | Status: DC
Start: 2021-05-06 — End: 2022-01-26
  Filled 2021-05-06: qty 9, 30d supply, fill #0
  Filled 2021-05-06: qty 10.5, 35d supply, fill #0
  Filled 2021-05-14: qty 9, 30d supply, fill #0

## 2021-05-06 MED ORDER — INSULIN PEN NEEDLE 31G X 5 MM MISC
1.0000 | Freq: Every day | 3 refills | Status: DC
  Filled 2021-05-06 – 2021-05-14 (×2): qty 100, 30d supply, fill #0

## 2021-05-06 NOTE — Patient Instructions (Signed)
Thank you, Mr.Hawken Mackley for allowing [place] to provide your care today. Today we discussed:  Type 2 diabetes mellitus: Today your hemoglobin A1c was 6.5%.  Please continue taking your Metformin 1000 mg twice a day.  Today we also discussed starting a medication called liraglutide which is a diabetes medication that can also help with weight loss.  I have included information about the injection and the paperwork.  You will start this medication at a dose of 0.6 mg and do this injection every day for 1 week.  After 1 week we would like for you to increase the dose to 1.2 mg every day for 1 week, followed by 1.8 mg every day for 1 week, followed by 2.4 mg every day for 1 week, followed by 3.0 mg indefinitely every day.  Please try to get an eye exam scheduled in the upcoming months.  High blood pressure: Your blood pressure today was 129/87 which is great.  Please continue taking your lisinopril-hydrochlorothiazide 2 tablets once a day every day.  We will check your kidney function electrolytes today.    I have ordered the following labs for you:  Lab Orders         Glucose, capillary         POC Hbg A1C      I will call if any are abnormal. All of your labs can be accessed through "My Chart".   My Chart Access: [email or web address]  Please follow-up in 1 month for a telehealth visit to see how you are doing on the liraglutide.  Please make sure to arrive 15 minutes prior to your next appointment. If you arrive late, you may be asked to reschedule.    We look forward to seeing you next time. Please call our clinic at [phone] if you have any questions or concerns. The best time to call is Monday-Friday from 9am-4pm, but there is someone available 24/7. If after hours or the weekend, call the main hospital number and ask for the Internal Medicine Resident On-Call. If you need medication refills, please notify your pharmacy one week in advance and  they will send [place] a request.   Thank you for letting us take part in your care. Wishing you the best!  Ellison Carwin, MD 05/06/2021, 9:51 AM IM Resident, PGY-1

## 2021-05-06 NOTE — Assessment & Plan Note (Signed)
Patient presents today for routine health visit. He has a history of hypertension for which he takes lisinopril-hydrochlorothiazide 40mg -25mg  once daily. Blood pressure today 129/87. Blood pressure well controlled on current regimen.  Plan: -Continue lisinopril-hydrochlorothiazide 40mg -25mg  once daily

## 2021-05-06 NOTE — Progress Notes (Signed)
  CC: Routine health visit  HPI:  JoshuaJoshua Petty is a 55 y.o. male with a past medical history stated below and presents today for routine health visit. Please see problem based assessment and plan for additional details.  Past Medical History:  Diagnosis Date   Diabetes (HCC)    Gonorrhea    Hyperlipidemia    Hypertension     Current Outpatient Medications on File Prior to Visit  Medication Sig Dispense Refill   atorvastatin (LIPITOR) 40 MG tablet Take 1 tablet (40 mg total) by mouth daily. 90 tablet 3   lisinopril-hydrochlorothiazide (ZESTORETIC) 20-12.5 MG tablet Take 2 tablets by mouth once daily 180 tablet 0   metFORMIN (GLUCOPHAGE) 1000 MG tablet Take 1 tablet (1,000 mg total) by mouth 2 (two) times daily with a meal. 180 tablet 3   No current facility-administered medications on file prior to visit.    Family History  Problem Relation Age of Onset   Hypertension Mother    Diabetes Father    Diabetes Sister     Review of Systems: ROS negative except for what is noted on the assessment and plan.  Vitals:   05/06/21 0916  BP: 129/87  Pulse: 95  Temp: (!) 97.5 F (36.4 C)  TempSrc: Oral  SpO2: 100%  Weight: 270 lb (122.5 kg)  Height: 5\' 7"  (1.702 m)     Physical Exam: General: Well appearing, obese african male, NAD HENT: normocephalic, atraumatic. MMM EYES: conjunctiva non-erythematous, no scleral icterus, disconjugate gaze CV: regular rate, normal rhythm, no murmurs, rubs, gallops. No LEE. Pedal pulses palpable Pulmonary: normal work of breathing on RA, lungs clear to auscultation, no rales, wheezes, rhonchi Abdominal: non-distended, soft, non-tender to palpation, normal BS Skin: Warm and dry, no rashes or lesions Neurological: MS: awake, alert and oriented x3, normal speech and fund of knowledge Motor: moves all extremities antigravity Psych: normal affect    Assessment & Plan:   See Encounters Tab for problem based  charting.  Patient seen with Dr. [place], M.D. [facility] Medicine, PGY-1 Pager: [phone] Date 05/06/2021 Time 11:42 AM

## 2021-05-06 NOTE — Assessment & Plan Note (Addendum)
Patient's Hgb A1c has improved from 05/2020 6.9% to 6.5% today. Patient denies polydipsia, endorses polyuria on diuretic. Denies hypoglycemia. Has not had much success with weight loss. Discussed lifestyle modifications today. Patient encouraged to schedule eye exam yearly.   Given difficulties with weight loss, we discussed starting liraglutide daily injection for weight management. Patient counseled on GI side effects when starting medication. He agrees to try liraglutide.  Plan: -Continue Metformin 1000mg  BID -Start liraglutide 0.6mg  injections daily and increase by 0.6mg  each week until weight management dose 3.0mg  achieved  -Liraglutide for weight management patient info attached to AVS -Pen needles ordered -Follow up in one month via telehealth visit to see if patient is tolerating medication -Foot exam today -Patient encouraged to get yearly eye exams if able to find affordable care

## 2021-05-07 LAB — BMP8+ANION GAP
Anion Gap: 18 mmol/L (ref 10.0–18.0)
BUN/Creatinine Ratio: 16 (ref 9–20)
BUN: 28 mg/dL — ABNORMAL HIGH (ref 6–24)
CO2: 21 mmol/L (ref 20–29)
Calcium: 9.6 mg/dL (ref 8.7–10.2)
Chloride: 98 mmol/L (ref 96–106)
Creatinine, Ser: 1.77 mg/dL — ABNORMAL HIGH (ref 0.76–1.27)
Glucose: 101 mg/dL — ABNORMAL HIGH (ref 70–99)
Potassium: 4 mmol/L (ref 3.5–5.2)
Sodium: 137 mmol/L (ref 134–144)
eGFR: 45 mL/min/{1.73_m2} — ABNORMAL LOW (ref >59)

## 2021-05-10 NOTE — Progress Notes (Signed)
Internal Medicine Clinic Attending ° °I saw and evaluated the patient.  I personally confirmed the key portions of the history and exam documented by Dr. Zinoviev and I reviewed pertinent patient test results.  The assessment, diagnosis, and plan were formulated together and I agree with the documentation in the resident’s note.  °

## 2021-05-14 ENCOUNTER — Other Ambulatory Visit (HOSPITAL_COMMUNITY): Payer: Self-pay

## 2021-06-26 ENCOUNTER — Other Ambulatory Visit: Payer: Self-pay | Admitting: Student

## 2021-06-26 DIAGNOSIS — I10 Essential (primary) hypertension: Secondary | ICD-10-CM

## 2021-09-24 ENCOUNTER — Other Ambulatory Visit: Payer: Self-pay | Admitting: Internal Medicine

## 2021-09-24 DIAGNOSIS — I10 Essential (primary) hypertension: Secondary | ICD-10-CM

## 2021-12-23 ENCOUNTER — Other Ambulatory Visit: Payer: Self-pay | Admitting: Internal Medicine

## 2021-12-23 DIAGNOSIS — I10 Essential (primary) hypertension: Secondary | ICD-10-CM

## 2021-12-28 NOTE — Telephone Encounter (Signed)
Pt has an appt 7/12 with Dr Sloan Leiter per chart.

## 2022-01-09 ENCOUNTER — Encounter: Payer: Self-pay | Admitting: *Deleted

## 2022-01-26 ENCOUNTER — Encounter: Payer: Self-pay | Admitting: Internal Medicine

## 2022-01-26 ENCOUNTER — Other Ambulatory Visit: Payer: Self-pay

## 2022-01-26 ENCOUNTER — Other Ambulatory Visit (HOSPITAL_COMMUNITY): Payer: Self-pay

## 2022-01-26 ENCOUNTER — Ambulatory Visit: Payer: Self-pay | Admitting: Internal Medicine

## 2022-01-26 VITALS — BP 137/99 | HR 87 | Temp 97.7°F | Resp 20 | Ht 67.0 in | Wt 275.4 lb

## 2022-01-26 DIAGNOSIS — I10 Essential (primary) hypertension: Secondary | ICD-10-CM

## 2022-01-26 DIAGNOSIS — E8881 Metabolic syndrome: Secondary | ICD-10-CM

## 2022-01-26 DIAGNOSIS — I129 Hypertensive chronic kidney disease with stage 1 through stage 4 chronic kidney disease, or unspecified chronic kidney disease: Secondary | ICD-10-CM

## 2022-01-26 DIAGNOSIS — E1122 Type 2 diabetes mellitus with diabetic chronic kidney disease: Secondary | ICD-10-CM

## 2022-01-26 DIAGNOSIS — H4010X0 Unspecified open-angle glaucoma, stage unspecified: Secondary | ICD-10-CM | POA: Insufficient documentation

## 2022-01-26 DIAGNOSIS — N1831 Chronic kidney disease, stage 3a: Secondary | ICD-10-CM

## 2022-01-26 DIAGNOSIS — E782 Mixed hyperlipidemia: Secondary | ICD-10-CM

## 2022-01-26 DIAGNOSIS — Z7984 Long term (current) use of oral hypoglycemic drugs: Secondary | ICD-10-CM

## 2022-01-26 DIAGNOSIS — E1100 Type 2 diabetes mellitus with hyperosmolarity without nonketotic hyperglycemic-hyperosmolar coma (NKHHC): Secondary | ICD-10-CM

## 2022-01-26 LAB — BASIC METABOLIC PANEL
Anion gap: 12 (ref 5–15)
BUN: 25 mg/dL — ABNORMAL HIGH (ref 6–20)
CO2: 26 mmol/L (ref 22–32)
Calcium: 9.8 mg/dL (ref 8.9–10.3)
Chloride: 99 mmol/L (ref 98–111)
Creatinine, Ser: 1.49 mg/dL — ABNORMAL HIGH (ref 0.61–1.24)
GFR, Estimated: 55 mL/min — ABNORMAL LOW (ref >60)
Glucose, Bld: 162 mg/dL — ABNORMAL HIGH (ref 70–99)
Potassium: 4.3 mmol/L (ref 3.5–5.1)
Sodium: 137 mmol/L (ref 135–145)

## 2022-01-26 LAB — POCT GLYCOSYLATED HEMOGLOBIN (HGB A1C): Hemoglobin A1C: 7.8 % — AB (ref 4.0–5.6)

## 2022-01-26 LAB — GLUCOSE, CAPILLARY: Glucose-Capillary: 166 mg/dL — ABNORMAL HIGH (ref 70–99)

## 2022-01-26 MED ORDER — ROSUVASTATIN CALCIUM 5 MG PO TABS
5.0000 mg | ORAL_TABLET | Freq: Every day | ORAL | 11 refills | Status: DC
  Filled 2022-01-26: qty 30, 30d supply, fill #0

## 2022-01-26 MED ORDER — LATANOPROST 0.005 % OP SOLN: 1.0000 [drp] | Freq: Every day | OPHTHALMIC | 1 refills | Status: AC

## 2022-01-26 MED ORDER — EMPAGLIFLOZIN 10 MG PO TABS
10.0000 mg | ORAL_TABLET | Freq: Every day | ORAL | 0 refills | Status: DC
  Filled 2022-01-26: qty 30, 30d supply, fill #0

## 2022-01-26 MED ORDER — TRUE METRIX BLOOD GLUCOSE TEST VI STRP
ORAL_STRIP | 2 refills | Status: AC
  Filled 2022-01-26: qty 50, 30d supply, fill #0

## 2022-01-26 MED ORDER — BETIMOL 0.5 % OP SOLN: 1.0000 [drp] | Freq: Every day | OPHTHALMIC | 1 refills | Status: AC

## 2022-01-26 MED ORDER — AMLODIPINE BESYLATE 5 MG PO TABS
5.0000 mg | ORAL_TABLET | Freq: Every day | ORAL | 11 refills | Status: DC
  Filled 2022-01-26: qty 30, 30d supply, fill #0

## 2022-01-26 MED ORDER — GLUCOSE BLOOD VI STRP
ORAL_STRIP | 12 refills | Status: DC
  Filled 2022-01-26: qty 100, fill #0

## 2022-01-26 MED ORDER — GLUCOSE BLOOD VI STRP
ORAL_STRIP | 12 refills | Status: AC
  Filled 2022-01-26: qty 50, 30d supply, fill #0

## 2022-01-26 MED ORDER — TRUEPLUS LANCETS 30G MISC
2 refills | Status: AC
  Filled 2022-01-26 (×2): qty 100, 30d supply, fill #0

## 2022-01-26 MED ORDER — TRUE METRIX METER W/DEVICE KIT
PACK | 0 refills | Status: AC
  Filled 2022-01-26: qty 1, 1d supply, fill #0

## 2022-01-26 MED ORDER — TRUE METRIX AIR GLUCOSE METER DEVI
2 refills | Status: DC
  Filled 2022-01-26: qty 100, fill #0

## 2022-01-26 NOTE — Patient Instructions (Signed)
Thank you, Joshua Petty for allowing [place] to provide your care today. Today we discussed:  Diabetes- Your A1c wented up about a point from the fall. This means your average blood sugars are high. Please STOP using insulin and we can try some additional pill medications for your diabetes.  -Continue metformin 1000 mg 2x daily -start empagliflozin 10 mg daily, come back in 4 weeks and we can consider increasing dose if you are doing well with this. This medication can increase risk of infection of the penis.  I have sent in lancets, I would like you to stop insulin but if you decide to continue it be sure that you are checking your blood sugar and not injecting if sugars are <100.  Blood pressure- Your blood pressure was high today. Please get a cuff and try to check BP 1 time a day. Bring log in when you come back. -continue lisinopril-HCTZ -start amlodipine 5 mg  Cholesterol- -Crestor 5 mg, if you have side affects let me know.  Glaucoma Please go see eye doctor, Your sight is very important!!  I have ordered the following labs for you:  Lab Orders         Glucose, capillary         BMP8+Anion Gap         Microalbumin / Creatinine Urine Ratio         POC Hbg A1C      Referrals ordered today:   Referral Orders  No referral(s) requested today     I have ordered the following medication/changed the following medications:   Stop the following medications: Medications Discontinued During This Encounter  Medication Reason   liraglutide (VICTOZA) 18 MG/3ML SOPN Discontinued by provider   atorvastatin (LIPITOR) 40 MG tablet Change in therapy     Start the following medications: Meds ordered this encounter  Medications   glucose blood test strip    Sig: Use as instructed    Dispense:  100 each    Refill:  12   timolol (BETIMOL) 0.5 % ophthalmic solution    Sig: Place 1 drop into both eyes daily.    Dispense:  5 mL    Refill:  1   latanoprost (XALATAN) 0.005 %  ophthalmic solution    Sig: Place 1 drop into both eyes daily.    Dispense:  2.5 mL    Refill:  1   empagliflozin (JARDIANCE) 10 MG TABS tablet    Sig: Take 1 tablet (10 mg total) by mouth daily before breakfast.    Dispense:  30 tablet    Refill:  0   rosuvastatin (CRESTOR) 5 MG tablet    Sig: Take 1 tablet (5 mg total) by mouth daily.    Dispense:  30 tablet    Refill:  11   amLODipine (NORVASC) 5 MG tablet    Sig: Take 1 tablet (5 mg total) by mouth daily.    Dispense:  30 tablet    Refill:  11     Follow up:  4 weeks    We look forward to seeing you next time. Please call our clinic at [phone] if you have any questions or concerns. The best time to call is Monday-Friday from 9am-4pm, but there is someone available 24/7. If after hours or the weekend, call the main hospital number and ask for the Internal Medicine Resident On-Call. If you need medication refills, please notify your pharmacy one week in advance and they will send [place] a request.  Thank you for trusting me with your care. Wishing you the best!   Rudene Christians, DO [facility]

## 2022-01-26 NOTE — Progress Notes (Signed)
[name] is a 56 year old with past medical history of type 2 diabetes, hypertension, hyperlipidemia    Subjective:  CC: HTN, diabetes  HPI:  [name] is a 55 y.o. male with a past medical history stated below and presents today for hypertension and diabetes management. Please see problem based assessment and plan for additional details.   Past medical history: Diabetes Hyperlipidemia Hypertension Metabolic syndrome History of balanitis  Current Outpatient Medications on File Prior to Visit  Medication Sig Dispense Refill   Insulin Pen Needle 31G X 5 MM MISC Use daily as directed 100 each 3   lisinopril-hydrochlorothiazide (ZESTORETIC) 20-12.5 MG tablet Take 2 tablets by mouth once daily 180 tablet 0   metFORMIN (GLUCOPHAGE) 1000 MG tablet Take 1 tablet (1,000 mg total) by mouth 2 (two) times daily with a meal. 180 tablet 3   No current facility-administered medications on file prior to visit.    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  Tobacco Use   Smoking status: Never   Smokeless tobacco: Never  Vaping Use   Vaping Use: Never used  Substance and Sexual Activity   Alcohol use: No   Drug use: Yes    Types: Marijuana    Comment: 4 joints/day   Sexual activity: Yes    Partners: Female    Birth control/protection: None  Other Topics Concern   Not on file  Social History Narrative   Significant other at bedside   Social Determinants of Health   Financial Resource Strain: Low Risk  (07/04/2017)   Overall Financial Resource Strain (CARDIA)    Difficulty of Paying Living Expenses: Not very hard   Food Insecurity: No Food Insecurity (07/04/2017)   Hunger Vital Sign    Worried About Running Out of Food in the Last Year: Never true    Ran Out of Food in the Last Year: Never true  Transportation Needs: No Transportation Needs (07/04/2017)   PRAPARE - Administrator, Civil Service (Medical): No    Lack of Transportation (Non-Medical): No  Physical Activity: Insufficiently Active (07/04/2017)   Exercise Vital Sign    Days of Exercise per Week: 2 days    Minutes of Exercise per Session: 30 min  Stress: No Stress Concern Present (07/04/2017)   [employer] of Occupational Health - Occupational Stress Questionnaire    Feeling of Stress : Not at all  Social Connections: Socially Isolated (07/04/2017)   Social Connection and Isolation Panel [NHANES]    Frequency of Communication with Friends and Family: Once a week    Frequency of Social Gatherings with Friends and Family: Once a week    Attends Religious Services: Never    Database administrator or Organizations: No    Attends Banker Meetings: Never    Marital Status: Divorced  Catering manager Violence: Not At Risk (07/04/2017)   Humiliation, Afraid, Rape, and Kick questionnaire    Fear of Current or Ex-Partner: No    Emotionally Abused: No    Physically Abused: No    Sexually  Abused: No    Review of Systems: ROS negative except for what is noted on the assessment and plan.  Objective:   Vitals:   01/26/22 0913 01/26/22 0918 01/26/22 0945  BP: (!) 143/106 (!) 135/105 (!) 137/99  Pulse: 87 93 87  Resp: 20    Temp: 97.7 F (36.5 C)    TempSrc: Oral    SpO2: 98%    Weight: 275 lb 6.4 oz (124.9 kg)    Height: 5\' 7"  (1.702 m)      Physical Exam: Constitutional: well-appearing, in no acute distress, obese Cardiovascular: regular rate and rhythm, no m/r/g Pulmonary/Chest: normal work of breathing on room air, lungs clear to auscultation bilaterally Abdominal: soft, non-tender,  non-distended MSK: normal bulk and tone Psych: Normal mood and affect   Assessment & Plan:  Uncontrolled type 2 diabetes mellitus with hyperosmolar nonketotic hyperglycemia (HCC) Patient with history of CKD stage III A presents for follow-up for diabetes.  He states that he has been taking metformin 1000 mg twice daily.  He did try liraglutide medication that was prescribed in October, but experienced nausea and discontinued this medication.  He started using insulin 70/30 5 mg daily to help with his blood sugars.  This was his therapy for diabetes in 2021.  He has not checked blood sugars in several months.  Denies polydipsia, polyuria, blurry vision.  He also has not had signs concerning of hypoglycemia.  I talked with patient about calling clinic if he is having trouble tolerating medications and that we could make recommendations for him over the phone.  His hemoglobin A1c has increased from 6.5-7.8 today. A/P: Presentation consistent with uncontrolled diabetes mellitus.  In setting of his renal disease addition of SGLT2 inhibitor would be beneficial.  He does have a history of balanitis in 2017, but reports no further difficulties since then.  I talked with patient about stopping insulin and trying pill medication to help with his diabetes.  I did send in lancets and glucometer in case he decided to inject insulin regardless so that he would have a way to check blood sugars. I encouraged him to please call the clinic before taking medications that are not prescribed to him. Continue metformin 1000 mg twice daily, [facility] P-IM program Start empagliflozin 10 mg daily through IM program Follow-up in 4 weeks -Lancets -true metrix glucometer sent to [facility]  Stage 3a chronic kidney disease (CKD) (HCC) Patient with history of CKD stage III a.  BMP in October 2022 with creatinine of 1.7 and GFR 45.  His baseline creatinine appears to be around 1.6-1.7.  I talked with patient about chronic renal disease and  he is unaware that he has that.  We talked about he is at a stage that we could work to control his diabetes and blood pressure to help prevent worsening of his renal function. -repeat BMP -Urine microalbumin to creatinine ratio  HLD (hyperlipidemia) Patient with history of hyperlipidemia.  Last lipid panel done in 2021 showed cholesterol at 203, LDL 78 with triglycerides 2022.  ASCVD risk at 25.2%.  He was taking atorvastatin 40 mg but discontinued this medication because of constipation.  He states that when he has constipation he goes every couple of days and it is difficult to have bowel movements.  He has not had any difficulties with bowel movements since to discontinuing atorvastatin several months ago. A/P: Low likelihood that atorvastatin led to constipation.  On chart review he has a history of having constipation and discontinuing medications  for diabetes as well.  He is open to trying other statin medications to see if they affect him in the same way. -Start rosuvastatin 5 mg, ideally would have patient on high intensity statin but would prefer to titrate medication as able -If continues to have difficulty with constipation consider bowel regimen   Essential hypertension Patient with CKD stage IIIa presents for hypertension management.  He reports taking lisinopril-hydrochlorothiazide 40-25 once daily.  He typically takes this medication in the morning and has noticed diuretic effect.  Blood pressure initially at 143/106.  Recheck showed blood pressure of 137/99.  He does not check his blood pressure at home. A/P: Continue lisinopril-hydrochlorothiazide 40-25 mg daily Start amlodipine 5 mg daily, IM program -BMP, urine microalbumin to creatinine ratio   Open-angle glaucoma Patient with history of open-angle glaucoma.  He has followed with ophthalmology in the past.  He states that he continues to use latanoprost and timolol eyedrops as prescribed.  He has not been evaluated by  ophthalmology since 2021. A/P: Patient encouraged to follow-up with ophthalmology -Continue timolol and latanoprost eyedrops -Patient list reviewed and updated  Metabolic syndrome Patient meets criteria for metabolic syndrome due to diabetes, HDL less than 40, elevated triglycerides, hypertension, and obesity. A/P: At visit today we briefly talked about obesity and importance of weight loss.  Please spend more time discussing this at follow-up appointment in 4 weeks.  Patient is currently uninsured, but would benefit from prep and nutrition counseling.  He states that he does go to the gym rarely now.     Patient discussed with Dr. Josetta Huddle Deslyn Cavenaugh, D.O. [facility]  PGY-2 Pager: [phone]  Phone: [phone] Date 01/26/2022  Time 11:22 AM

## 2022-01-26 NOTE — Assessment & Plan Note (Addendum)
Patient with history of CKD stage III A presents for follow-up for diabetes.  He states that he has been taking metformin 1000 mg twice daily.  He did try liraglutide medication that was prescribed in October, but experienced nausea and discontinued this medication.  He started using insulin 70/30 5 mg daily to help with his blood sugars.  This was his therapy for diabetes in 2021.  He has not checked blood sugars in several months.  Denies polydipsia, polyuria, blurry vision.  He also has not had signs concerning of hypoglycemia.  I talked with patient about calling clinic if he is having trouble tolerating medications and that we could make recommendations for him over the phone.  His hemoglobin A1c has increased from 6.5-7.8 today. A/P: Presentation consistent with uncontrolled diabetes mellitus.  In setting of his renal disease addition of SGLT2 inhibitor would be beneficial.  He does have a history of balanitis in 2017, but reports no further difficulties since then.  I talked with patient about stopping insulin and trying pill medication to help with his diabetes.  I did send in lancets and glucometer in case he decided to inject insulin regardless so that he would have a way to check blood sugars. I encouraged him to please call the clinic before taking medications that are not prescribed to him. Continue metformin 1000 mg twice daily, [facility] P[facility] program Start empagliflozin 10 mg daily through IM program Follow-up in 4 weeks -Lancets -true metrix glucometer sent to [facility]

## 2022-01-26 NOTE — Assessment & Plan Note (Signed)
Patient with history of open-angle glaucoma.  He has followed with ophthalmology in the past.  He states that he continues to use latanoprost and timolol eyedrops as prescribed.  He has not been evaluated by ophthalmology since 2021. A/P: Patient encouraged to follow-up with ophthalmology -Continue timolol and latanoprost eyedrops -Patient list reviewed and updated

## 2022-01-26 NOTE — Assessment & Plan Note (Signed)
Patient with history of hyperlipidemia.  Last lipid panel done in 2021 showed cholesterol at 203, LDL 78 with triglycerides 859.  ASCVD risk at 25.2%.  He was taking atorvastatin 40 mg but discontinued this medication because of constipation.  He states that when he has constipation he goes every couple of days and it is difficult to have bowel movements.  He has not had any difficulties with bowel movements since to discontinuing atorvastatin several months ago. A/P: Low likelihood that atorvastatin led to constipation.  On chart review he has a history of having constipation and discontinuing medications for diabetes as well.  He is open to trying other statin medications to see if they affect him in the same way. -Start rosuvastatin 5 mg, ideally would have patient on high intensity statin but would prefer to titrate medication as able -If continues to have difficulty with constipation consider bowel regimen

## 2022-01-26 NOTE — Assessment & Plan Note (Signed)
Patient with history of CKD stage III a.  BMP in October 2022 with creatinine of 1.7 and GFR 45.  His baseline creatinine appears to be around 1.6-1.7.  I talked with patient about chronic renal disease and he is unaware that he has that.  We talked about he is at a stage that we could work to control his diabetes and blood pressure to help prevent worsening of his renal function. -repeat BMP -Urine microalbumin to creatinine ratio

## 2022-01-26 NOTE — Assessment & Plan Note (Signed)
Patient with CKD stage IIIa presents for hypertension management.  He reports taking lisinopril-hydrochlorothiazide 40-25 once daily.  He typically takes this medication in the morning and has noticed diuretic effect.  Blood pressure initially at 143/106.  Recheck showed blood pressure of 137/99.  He does not check his blood pressure at home. A/P: Continue lisinopril-hydrochlorothiazide 40-25 mg daily Start amlodipine 5 mg daily, IM program -BMP, urine microalbumin to creatinine ratio

## 2022-01-26 NOTE — Assessment & Plan Note (Signed)
Patient meets criteria for metabolic syndrome due to diabetes, HDL less than 40, elevated triglycerides, hypertension, and obesity. A/P: At visit today we briefly talked about obesity and importance of weight loss.  Please spend more time discussing this at follow-up appointment in 4 weeks.  Patient is currently uninsured, but would benefit from prep and nutrition counseling.  He states that he does go to the gym rarely now.

## 2022-01-27 LAB — MICROALBUMIN / CREATININE URINE RATIO
Creatinine, Urine: 180.4 mg/dL
Microalb Creat Ratio: 136 mg/g creat — ABNORMAL HIGH (ref 0–29)
Microalb, Ur: 246.2 ug/mL — ABNORMAL HIGH

## 2022-01-27 NOTE — Progress Notes (Signed)
Internal Medicine Clinic Attending  Case discussed with Dr. Masters  At the time of the visit.  We reviewed the resident's history and exam and pertinent patient test results.  I agree with the assessment, diagnosis, and plan of care documented in the resident's note.  

## 2022-02-03 ENCOUNTER — Other Ambulatory Visit (HOSPITAL_COMMUNITY): Payer: Self-pay

## 2022-02-23 ENCOUNTER — Encounter: Payer: Self-pay | Admitting: Student

## 2022-02-23 ENCOUNTER — Other Ambulatory Visit: Payer: Self-pay

## 2022-02-23 ENCOUNTER — Other Ambulatory Visit (HOSPITAL_COMMUNITY): Payer: Self-pay

## 2022-02-23 ENCOUNTER — Ambulatory Visit: Payer: Self-pay | Admitting: Student

## 2022-02-23 DIAGNOSIS — E1100 Type 2 diabetes mellitus with hyperosmolarity without nonketotic hyperglycemic-hyperosmolar coma (NKHHC): Secondary | ICD-10-CM

## 2022-02-23 DIAGNOSIS — Z7984 Long term (current) use of oral hypoglycemic drugs: Secondary | ICD-10-CM

## 2022-02-23 DIAGNOSIS — I10 Essential (primary) hypertension: Secondary | ICD-10-CM

## 2022-02-23 DIAGNOSIS — E782 Mixed hyperlipidemia: Secondary | ICD-10-CM

## 2022-02-23 MED ORDER — ROSUVASTATIN CALCIUM 10 MG PO TABS
10.0000 mg | ORAL_TABLET | Freq: Every day | ORAL | 3 refills | Status: DC
  Filled 2022-02-23: qty 30, 30d supply, fill #0

## 2022-02-23 MED ORDER — EMPAGLIFLOZIN 10 MG PO TABS
10.0000 mg | ORAL_TABLET | Freq: Every day | ORAL | 5 refills | Status: DC
  Filled 2022-02-23: qty 30, 30d supply, fill #0

## 2022-02-23 NOTE — Patient Instructions (Signed)
Thank you, [name] for allowing [place] to provide your care today. Today we discussed your diabetes, high blood pressure, weight and cholesterol. It is very important that you pick up your medications and take them as prescribed. Continue to make changes to your diet and exercise 2-3 times a week.    I have ordered the following medication/changed the following medications:  Start Amlodipine 5 mg daily Start Jardiance 10 mg daily Start Crestor 10 mg daily  My Chart Access: [email or web address]  Please follow-up in 2 months  Please make sure to arrive 15 minutes prior to your next appointment. If you arrive late, you may be asked to reschedule.    We look forward to seeing you next time. Please call our clinic at [phone] if you have any questions or concerns. The best time to call is Monday-Friday from 9am-4pm, but there is someone available 24/7. If after hours or the weekend, call the main hospital number and ask for the Internal Medicine Resident On-Call. If you need medication refills, please notify your pharmacy one week in advance and they will send [place] a request.   Thank you for letting us take part in your care. Wishing you the best!  Steffanie Rainwater, MD 02/23/2022, 9:29 AM IM Resident, PGY-3 [place]ne Heck 41:10

## 2022-02-23 NOTE — Progress Notes (Signed)
   CC: Diabetes  HPI:  Mr.Zaveon Gaughan is a 56 y.o. male with PMH as below who presents to clinic to follow-up on his diabetes. Please see problem based charting for evaluation, assessment and plan.  Past Medical History:  Diagnosis Date   Diabetes (HCC)    Gonorrhea    Hyperlipidemia    Hypertension     Review of Systems:  Constitutional: Negative for fever, polydipsia or fatigue Eyes: Negative for visual changes Respiratory: Negative for shortness of breath Abdomen: Negative for abdominal pain, constipation or diarrhea Neuro: Negative for headache, numbness or weakness  Physical Exam: General: Pleasant, obese middle-age male.  No acute distress. Cardiac: RRR. No murmurs, rubs or gallops. No LE edema Respiratory: Lungs CTAB. No wheezing or crackles. Abdominal: Soft, symmetric and non tender. Normal BS. Skin: Warm, dry and intact without rashes or lesions Extremities: Atraumatic. Full ROM. Palpable radial and DP pulses. Neuro: A&O x 3. Moves all extremities Psych: Appropriate mood and affect.  Vitals:   02/23/22 0854 02/23/22 0902  BP: (!) 143/89 (!) 132/101  Pulse: (!) 105 94  Temp: (!) 97.5 F (36.4 C)   TempSrc: Oral   SpO2: 100%   Weight: 274 lb 1.6 oz (124.3 kg)   Height: 5\' 8"  (1.727 m)     Assessment & Plan:   See Encounters Tab for problem based charting.  Patient discussed with Dr. , MD, MPH

## 2022-02-25 ENCOUNTER — Encounter: Payer: Self-pay | Admitting: Student

## 2022-02-25 ENCOUNTER — Other Ambulatory Visit (HOSPITAL_COMMUNITY): Payer: Self-pay

## 2022-02-25 MED ORDER — AMLODIPINE BESYLATE 5 MG PO TABS
5.0000 mg | ORAL_TABLET | Freq: Every day | ORAL | 11 refills | Status: DC
  Filled 2022-02-25: qty 30, 30d supply, fill #0

## 2022-02-25 NOTE — Assessment & Plan Note (Signed)
Patient here to follow-up his blood pressure.  SBP initially elevated to 140s.  Improved to the 130s on repeat.  DBP still elevated.  Of note, patient states he did not know that he was started on amlodipine during his last office visit so has not taken the prescription.  Advised patient to pick up amlodipine at the [facility]   Plan: -Continue lisinopril-HCTZ 20-25 mg daily -Start amlodipine 5 mg daily -Repeat BP and BMP at next visit

## 2022-02-25 NOTE — Assessment & Plan Note (Signed)
Patient reports he was unaware that he has been prescribed a new cholesterol medication. Due to his elevated ASCVD risk of 25.2%, will increase rosuvastatin from 5 mg to 10 mg daily. -Start rosuvastatin 10 mg daily -Repeat lipid panel in 3 to 6 weeks

## 2022-02-25 NOTE — Progress Notes (Signed)
Internal Medicine Clinic Attending  Case discussed with Dr. [place]  At the time of the visit.  We reviewed the resident's history and exam and pertinent patient test results.  I agree with the assessment, diagnosis, and plan of care documented in the resident's note.  

## 2022-02-25 NOTE — Assessment & Plan Note (Signed)
Patient here for diabetes follow-up after recent changes to his regimen 4 weeks ago. A1c from 4 weeks ago 27.8%. patient was started on Jardiance at that visit, but patient states he did not know about this prescription. Patient instructed to pick up this prescription at [facility] when he leaves clinic today. Patient expressed understanding.  States he is working on his diet and trying to exercise to lose some weight. Patient will benefit from starting a GLP-1 agonist in addition to his current regimen due to his obesity.  Plan: -Continue metformin 1000 mg twice daily -Start Jardiance 10 mg daily -Repeat A1c in 2 months -Consider starting GLP-1 agonist office visit and referral to the exercise program

## 2022-03-09 ENCOUNTER — Other Ambulatory Visit (HOSPITAL_COMMUNITY): Payer: Self-pay

## 2022-03-14 ENCOUNTER — Other Ambulatory Visit: Payer: Self-pay | Admitting: Internal Medicine

## 2022-03-14 DIAGNOSIS — I10 Essential (primary) hypertension: Secondary | ICD-10-CM

## 2022-06-15 ENCOUNTER — Other Ambulatory Visit: Payer: Self-pay | Admitting: Student

## 2022-06-15 DIAGNOSIS — E118 Type 2 diabetes mellitus with unspecified complications: Secondary | ICD-10-CM

## 2022-06-16 ENCOUNTER — Other Ambulatory Visit: Payer: Self-pay | Admitting: Student

## 2022-06-16 DIAGNOSIS — I10 Essential (primary) hypertension: Secondary | ICD-10-CM

## 2022-06-16 NOTE — Telephone Encounter (Signed)
Approved for diabetes management

## 2022-06-16 NOTE — Telephone Encounter (Signed)
Approved for BP

## 2022-09-16 ENCOUNTER — Other Ambulatory Visit: Payer: Self-pay | Admitting: Student

## 2022-09-16 DIAGNOSIS — E118 Type 2 diabetes mellitus with unspecified complications: Secondary | ICD-10-CM

## 2022-09-16 DIAGNOSIS — I10 Essential (primary) hypertension: Secondary | ICD-10-CM

## 2022-12-14 ENCOUNTER — Other Ambulatory Visit: Payer: Self-pay | Admitting: Student

## 2022-12-14 DIAGNOSIS — E118 Type 2 diabetes mellitus with unspecified complications: Secondary | ICD-10-CM

## 2022-12-14 DIAGNOSIS — I10 Essential (primary) hypertension: Secondary | ICD-10-CM

## 2022-12-14 NOTE — Telephone Encounter (Signed)
LOV -02/23/22. I called pt to schedule an appt - no answer and vm has not been set-up.

## 2023-01-25 ENCOUNTER — Encounter: Payer: Self-pay | Admitting: Student

## 2023-01-25 ENCOUNTER — Other Ambulatory Visit: Payer: Self-pay

## 2023-01-25 ENCOUNTER — Ambulatory Visit (INDEPENDENT_AMBULATORY_CARE_PROVIDER_SITE_OTHER): Payer: Medicaid Other | Admitting: Student

## 2023-01-25 VITALS — BP 109/86 | HR 93 | Temp 98.1°F | Ht 67.0 in | Wt 229.3 lb

## 2023-01-25 DIAGNOSIS — R1084 Generalized abdominal pain: Secondary | ICD-10-CM

## 2023-01-25 DIAGNOSIS — R109 Unspecified abdominal pain: Secondary | ICD-10-CM | POA: Diagnosis present

## 2023-01-25 DIAGNOSIS — R634 Abnormal weight loss: Secondary | ICD-10-CM | POA: Diagnosis not present

## 2023-01-25 NOTE — Assessment & Plan Note (Signed)
Notes weight loss which is not all intentional of almost 45 lbs since 02/2022. Weight today was 229 lb from 274 lb in August. He has noticed gradual weight loss. Appetite prior to acute abdominal pain was well. Denies any night sweats or chills. Screening FOBT negative in 2021, no follow up since then. He would like to further discuss the weight loss at next visit once acute abdominal pain resolves.

## 2023-01-25 NOTE — Patient Instructions (Signed)
Thank you, Joshua Petty for allowing [place] to provide your care today. Today we discussed your abdominal pain and loose stools.  -Please stay hydrated at home. You can try a soft and bland diet as your abdominal pain improves. -No fever or chills and exam today are reassuring. I think abdominal pain and loose stools likely after July 4th cookout and it should resolve on its own.  -Follow up in 2 weeks to see how you are doing. -We can discuss more about the weight loss, we discussed some about screening colonoscopy today.      Follow up:  2 weeks    Should you have any questions or concerns please call the internal medicine clinic at [phone].    Rana Snare, D.O. [facility]

## 2023-01-25 NOTE — Progress Notes (Signed)
CC: abdominal pain, fatigue  HPI:  [name] is a 57 y.o. male living with a history stated below and presents today for abdominal pain and fatigue. Please see problem based assessment and plan for additional details.  Past Medical History:  Diagnosis Date   Diabetes (HCC)    Gonorrhea    Hyperlipidemia    Hypertension     Current Outpatient Medications on File Prior to Visit  Medication Sig Dispense Refill   amLODipine (NORVASC) 5 MG tablet Take 1 tablet (5 mg total) by mouth daily. 30 tablet 11   Blood Glucose Monitoring Suppl (TRUE METRIX METER) w/Device KIT If you are injecting insulin you MUST check your blood sugar. If <100 do not inject insulin 1 kit 0   empagliflozin (JARDIANCE) 10 MG TABS tablet Take 1 tablet (10 mg total) by mouth daily before breakfast. 30 tablet 5   glucose blood (TRUE METRIX BLOOD GLUCOSE TEST) test strip If you are injecting insulin you MUST check your blood sugar. If <100 do not inject insulin 100 each 2   glucose blood test strip Use as instructed 100 each 12   Insulin Pen Needle 31G X 5 MM MISC Use daily as directed 100 each 3   latanoprost (XALATAN) 0.005 % ophthalmic solution Place 1 drop into both eyes daily. 2.5 mL 1   lisinopril-hydrochlorothiazide (ZESTORETIC) 20-12.5 MG tablet Take 2 tablets by mouth once daily 180 tablet 0   metFORMIN (GLUCOPHAGE) 1000 MG tablet TAKE 1 TABLET BY MOUTH TWICE DAILY WITH A MEAL 180 tablet 0   rosuvastatin (CRESTOR) 10 MG tablet Take 1 tablet (10 mg total) by mouth daily. 90 tablet 3   timolol (BETIMOL) 0.5 % ophthalmic solution Place 1 drop into both eyes daily. 5 mL 1   TRUEplus Lancets 30G MISC If you are injecting insulin you MUST check your blood sugar. If <100 do not inject insulin 100 each 2   No current facility-administered medications on file prior to visit.   Review of Systems: ROS negative except for what is noted on the assessment and plan.  Vitals:   01/25/23 1041 01/25/23 1049  BP:  (!) 119/94 109/86  Pulse: 98 93  Temp: 98.1 F (36.7 C)   TempSrc: Oral   SpO2: 100%   Weight: 229 lb 4.8 oz (104 kg)   Height: 5\' 7"  (1.702 m)    Physical Exam: Constitutional: alert, male sitting on exam table comfortably, in no acute distress Cardiovascular: regular rate and rhythm Pulmonary/Chest: normal work of breathing on room air, lungs clear to auscultation bilaterally Abdominal: bowel sounds present, soft, non-distended, diffuse TTP L>R without guarding or rebound  Neurological: alert & oriented x 3 Skin: warm and dry Psych: pleasant mood  Assessment & Plan:   Abdominal pain Presents with diffuse abdominal pain x 5-6 days. Started the day after 7/4 cookout. Associated with loose stools which were watery and non-bloody and occasional nausea. No vomiting and tolerating po intake. States he grilled hotdogs, hamburgers and steaks which he ate. Denies eating much anything else at cookout. Unsure if anyone else was having GI symptoms afterwards. Has felt fatigue since pain started which he attributes to decreased appetite. No prior hx of abdominal pain like this. Abdominal exam with diffuse tenderness without guarding or rebound. No signs of acute abdomen. Vitals appear stable today and afebrile. Presentation appears likely acute gastroenteritis, will treat supportively for now, anticipate pain and loose stools to improve/resolve. Return precautions to clinic or emergency care given.  Weight loss Notes weight loss which is not all intentional of almost 45 lbs since 02/2022. Weight today was 229 lb from 274 lb in August. He has noticed gradual weight loss. Appetite prior to acute abdominal pain was well. Denies any night sweats or chills. Screening FOBT negative in 2021, no follow up since then. He would like to further discuss the weight loss at next visit once acute abdominal pain resolves.    Patient discussed with Dr. Docia Furl, D.O. [facility],  PGY-2 Phone: [phone] Date 01/25/2023 Time 9:17 PM

## 2023-01-25 NOTE — Assessment & Plan Note (Addendum)
Presents with diffuse abdominal pain x 5-6 days. Started the day after 7/4 cookout. Associated with loose stools which were watery and non-bloody and occasional nausea. No vomiting and tolerating po intake. States he grilled hotdogs, hamburgers and steaks which he ate. Denies eating much anything else at cookout. Unsure if anyone else was having GI symptoms afterwards. Has felt fatigue since pain started which he attributes to decreased appetite. No prior hx of abdominal pain like this. Abdominal exam with diffuse tenderness without guarding or rebound. No signs of acute abdomen. Vitals appear stable today and afebrile. Presentation appears likely acute gastroenteritis, will treat supportively for now, anticipate pain and loose stools to improve/resolve. Return precautions to clinic or emergency care given.

## 2023-01-26 ENCOUNTER — Encounter: Payer: Self-pay | Admitting: Student

## 2023-01-31 NOTE — Progress Notes (Signed)
 Internal Medicine Clinic Attending  Case discussed with the resident at the time of the visit.  We reviewed the resident's history and exam and pertinent patient test results.  I agree with the assessment, diagnosis, and plan of care documented in the resident's note.  

## 2023-02-09 ENCOUNTER — Emergency Department (HOSPITAL_COMMUNITY): Payer: Medicaid Other

## 2023-02-09 ENCOUNTER — Other Ambulatory Visit: Payer: Self-pay

## 2023-02-09 ENCOUNTER — Encounter: Payer: Self-pay | Admitting: Student

## 2023-02-09 ENCOUNTER — Ambulatory Visit (INDEPENDENT_AMBULATORY_CARE_PROVIDER_SITE_OTHER): Payer: Medicaid Other | Admitting: Student

## 2023-02-09 ENCOUNTER — Encounter (HOSPITAL_COMMUNITY): Payer: Self-pay | Admitting: Emergency Medicine

## 2023-02-09 ENCOUNTER — Inpatient Hospital Stay (HOSPITAL_COMMUNITY)
Admission: EM | Admit: 2023-02-09 | Discharge: 2023-02-11 | DRG: 638 | Disposition: A | Discharge status: Home / Self Care | Payer: Medicaid Other | Attending: Internal Medicine | Admitting: Internal Medicine

## 2023-02-09 ENCOUNTER — Encounter: Payer: Self-pay | Admitting: Dietician

## 2023-02-09 VITALS — BP 100/65 | HR 102 | Temp 97.5°F | Wt 219.1 lb

## 2023-02-09 DIAGNOSIS — I129 Hypertensive chronic kidney disease with stage 1 through stage 4 chronic kidney disease, or unspecified chronic kidney disease: Secondary | ICD-10-CM | POA: Diagnosis present

## 2023-02-09 DIAGNOSIS — E11319 Type 2 diabetes mellitus with unspecified diabetic retinopathy without macular edema: Secondary | ICD-10-CM | POA: Diagnosis present

## 2023-02-09 DIAGNOSIS — Y92009 Unspecified place in unspecified non-institutional (private) residence as the place of occurrence of the external cause: Secondary | ICD-10-CM

## 2023-02-09 DIAGNOSIS — E1100 Type 2 diabetes mellitus with hyperosmolarity without nonketotic hyperglycemic-hyperosmolar coma (NKHHC): Secondary | ICD-10-CM | POA: Diagnosis present

## 2023-02-09 DIAGNOSIS — E860 Dehydration: Secondary | ICD-10-CM | POA: Diagnosis present

## 2023-02-09 DIAGNOSIS — I951 Orthostatic hypotension: Secondary | ICD-10-CM | POA: Diagnosis present

## 2023-02-09 DIAGNOSIS — E1122 Type 2 diabetes mellitus with diabetic chronic kidney disease: Secondary | ICD-10-CM | POA: Diagnosis present

## 2023-02-09 DIAGNOSIS — M62838 Other muscle spasm: Secondary | ICD-10-CM | POA: Diagnosis present

## 2023-02-09 DIAGNOSIS — Z560 Unemployment, unspecified: Secondary | ICD-10-CM

## 2023-02-09 DIAGNOSIS — G8929 Other chronic pain: Secondary | ICD-10-CM | POA: Diagnosis present

## 2023-02-09 DIAGNOSIS — E118 Type 2 diabetes mellitus with unspecified complications: Secondary | ICD-10-CM

## 2023-02-09 DIAGNOSIS — E1165 Type 2 diabetes mellitus with hyperglycemia: Principal | ICD-10-CM | POA: Diagnosis present

## 2023-02-09 DIAGNOSIS — N1831 Chronic kidney disease, stage 3a: Secondary | ICD-10-CM | POA: Diagnosis present

## 2023-02-09 DIAGNOSIS — H538 Other visual disturbances: Secondary | ICD-10-CM | POA: Diagnosis present

## 2023-02-09 DIAGNOSIS — Z23 Encounter for immunization: Secondary | ICD-10-CM

## 2023-02-09 DIAGNOSIS — R634 Abnormal weight loss: Secondary | ICD-10-CM | POA: Diagnosis not present

## 2023-02-09 DIAGNOSIS — Z79899 Other long term (current) drug therapy: Secondary | ICD-10-CM

## 2023-02-09 DIAGNOSIS — E873 Alkalosis: Secondary | ICD-10-CM | POA: Diagnosis present

## 2023-02-09 DIAGNOSIS — M25562 Pain in left knee: Secondary | ICD-10-CM | POA: Diagnosis present

## 2023-02-09 DIAGNOSIS — E785 Hyperlipidemia, unspecified: Secondary | ICD-10-CM | POA: Diagnosis present

## 2023-02-09 DIAGNOSIS — Z8249 Family history of ischemic heart disease and other diseases of the circulatory system: Secondary | ICD-10-CM

## 2023-02-09 DIAGNOSIS — Z91128 Patient's intentional underdosing of medication regimen for other reason: Secondary | ICD-10-CM

## 2023-02-09 DIAGNOSIS — F1290 Cannabis use, unspecified, uncomplicated: Secondary | ICD-10-CM | POA: Diagnosis present

## 2023-02-09 DIAGNOSIS — T383X6A Underdosing of insulin and oral hypoglycemic [antidiabetic] drugs, initial encounter: Secondary | ICD-10-CM | POA: Diagnosis present

## 2023-02-09 DIAGNOSIS — M62830 Muscle spasm of back: Secondary | ICD-10-CM | POA: Diagnosis present

## 2023-02-09 DIAGNOSIS — R739 Hyperglycemia, unspecified: Principal | ICD-10-CM | POA: Diagnosis present

## 2023-02-09 DIAGNOSIS — Z833 Family history of diabetes mellitus: Secondary | ICD-10-CM

## 2023-02-09 DIAGNOSIS — Z7984 Long term (current) use of oral hypoglycemic drugs: Secondary | ICD-10-CM

## 2023-02-09 DIAGNOSIS — Z794 Long term (current) use of insulin: Secondary | ICD-10-CM

## 2023-02-09 DIAGNOSIS — K5900 Constipation, unspecified: Secondary | ICD-10-CM | POA: Diagnosis present

## 2023-02-09 DIAGNOSIS — N179 Acute kidney failure, unspecified: Secondary | ICD-10-CM | POA: Diagnosis present

## 2023-02-09 LAB — COMPREHENSIVE METABOLIC PANEL
ALT: 18 U/L (ref 0–44)
AST: 25 U/L (ref 15–41)
Albumin: 3.5 g/dL (ref 3.5–5.0)
Alkaline Phosphatase: 66 U/L (ref 38–126)
Anion gap: 15 (ref 5–15)
BUN: 36 mg/dL — ABNORMAL HIGH (ref 6–20)
CO2: 19 mmol/L — ABNORMAL LOW (ref 22–32)
Calcium: 9.2 mg/dL (ref 8.9–10.3)
Chloride: 86 mmol/L — ABNORMAL LOW (ref 98–111)
Creatinine, Ser: 1.72 mg/dL — ABNORMAL HIGH (ref 0.61–1.24)
GFR, Estimated: 46 mL/min — ABNORMAL LOW (ref >60)
Glucose, Bld: 691 mg/dL (ref 70–99)
Potassium: 4.4 mmol/L (ref 3.5–5.1)
Sodium: 120 mmol/L — ABNORMAL LOW (ref 135–145)
Total Bilirubin: 1.1 mg/dL (ref 0.3–1.2)
Total Protein: 8.1 g/dL (ref 6.5–8.1)

## 2023-02-09 LAB — GLUCOSE, CAPILLARY
Glucose-Capillary: >600 mg/dL (ref 70–99)
Glucose-Capillary: 433 mg/dL — ABNORMAL HIGH (ref 70–99)
Glucose-Capillary: 418 mg/dL — ABNORMAL HIGH (ref 70–99)
Glucose-Capillary: 255 mg/dL — ABNORMAL HIGH (ref 70–99)

## 2023-02-09 LAB — CBC WITH DIFFERENTIAL/PLATELET
Abs Immature Granulocytes: 0.03 10*3/uL (ref 0.00–0.07)
Basophils Absolute: 0.1 10*3/uL (ref 0.0–0.1)
Basophils Relative: 1 %
Eosinophils Absolute: 0 10*3/uL (ref 0.0–0.5)
Eosinophils Relative: 1 %
HCT: 38.9 % — ABNORMAL LOW (ref 39.0–52.0)
Hemoglobin: 13.9 g/dL (ref 13.0–17.0)
Immature Granulocytes: 0 %
Lymphocytes Relative: 25 %
Lymphs Abs: 2.1 10*3/uL (ref 0.7–4.0)
MCH: 30.5 pg (ref 26.0–34.0)
MCHC: 35.7 g/dL (ref 30.0–36.0)
MCV: 85.3 fL (ref 80.0–100.0)
Monocytes Absolute: 0.5 10*3/uL (ref 0.1–1.0)
Monocytes Relative: 6 %
Neutro Abs: 5.7 10*3/uL (ref 1.7–7.7)
Neutrophils Relative %: 67 %
Platelets: 303 10*3/uL (ref 150–400)
RBC: 4.56 MIL/uL (ref 4.22–5.81)
RDW: 11.8 % (ref 11.5–15.5)
WBC: 8.5 10*3/uL (ref 4.0–10.5)
nRBC: 0 % (ref 0.0–0.2)

## 2023-02-09 LAB — I-STAT VENOUS BLOOD GAS, ED
Acid-Base Excess: 2 mmol/L (ref 0.0–2.0)
Bicarbonate: 23.2 mmol/L (ref 20.0–28.0)
Calcium, Ion: 1.05 mmol/L — ABNORMAL LOW (ref 1.15–1.40)
HCT: 43 % (ref 39.0–52.0)
Hemoglobin: 14.6 g/dL (ref 13.0–17.0)
O2 Saturation: 100 %
Potassium: 4.6 mmol/L (ref 3.5–5.1)
Sodium: 121 mmol/L — ABNORMAL LOW (ref 135–145)
TCO2: 24 mmol/L (ref 22–32)
pCO2, Ven: 27.6 mmHg — ABNORMAL LOW (ref 44–60)
pH, Ven: 7.533 — ABNORMAL HIGH (ref 7.25–7.43)
pO2, Ven: 147 mmHg — ABNORMAL HIGH (ref 32–45)

## 2023-02-09 LAB — URINALYSIS, W/ REFLEX TO CULTURE (INFECTION SUSPECTED)
Bacteria, UA: NONE SEEN
Bilirubin Urine: NEGATIVE
Glucose, UA: >=500 mg/dL — AB
Hgb urine dipstick: NEGATIVE
Ketones, ur: 5 mg/dL — AB
Leukocytes,Ua: NEGATIVE
Nitrite: NEGATIVE
Protein, ur: NEGATIVE mg/dL
Specific Gravity, Urine: 1.021 (ref 1.005–1.030)
pH: 5 (ref 5.0–8.0)

## 2023-02-09 LAB — I-STAT CG4 LACTIC ACID, ED
Lactic Acid, Venous: 2.2 mmol/L (ref 0.5–1.9)
Lactic Acid, Venous: 3.1 mmol/L (ref 0.5–1.9)

## 2023-02-09 LAB — POCT URINALYSIS DIPSTICK
Bilirubin, UA: NEGATIVE
Blood, UA: NEGATIVE
Glucose, UA: POSITIVE — AB
Ketones, UA: NEGATIVE
Leukocytes, UA: NEGATIVE
Nitrite, UA: NEGATIVE
Protein, UA: NEGATIVE
Spec Grav, UA: 1.01 (ref 1.010–1.025)
Urobilinogen, UA: 0.2 E.U./dL
pH, UA: 5.5 (ref 5.0–8.0)

## 2023-02-09 LAB — BASIC METABOLIC PANEL
Anion gap: 14 (ref 5–15)
Anion gap: 14 (ref 5–15)
BUN: 35 mg/dL — ABNORMAL HIGH (ref 6–20)
BUN: 29 mg/dL — ABNORMAL HIGH (ref 6–20)
CO2: 21 mmol/L — ABNORMAL LOW (ref 22–32)
CO2: 25 mmol/L (ref 22–32)
Calcium: 9.1 mg/dL (ref 8.9–10.3)
Calcium: 9.2 mg/dL (ref 8.9–10.3)
Chloride: 85 mmol/L — ABNORMAL LOW (ref 98–111)
Chloride: 90 mmol/L — ABNORMAL LOW (ref 98–111)
Creatinine, Ser: 1.82 mg/dL — ABNORMAL HIGH (ref 0.61–1.24)
Creatinine, Ser: 1.47 mg/dL — ABNORMAL HIGH (ref 0.61–1.24)
GFR, Estimated: 43 mL/min — ABNORMAL LOW (ref >60)
GFR, Estimated: 55 mL/min — ABNORMAL LOW (ref >60)
Glucose, Bld: 743 mg/dL (ref 70–99)
Glucose, Bld: 334 mg/dL — ABNORMAL HIGH (ref 70–99)
Potassium: 4.6 mmol/L (ref 3.5–5.1)
Potassium: 3.5 mmol/L (ref 3.5–5.1)
Sodium: 120 mmol/L — ABNORMAL LOW (ref 135–145)
Sodium: 129 mmol/L — ABNORMAL LOW (ref 135–145)

## 2023-02-09 LAB — POCT GLYCOSYLATED HEMOGLOBIN (HGB A1C): HbA1c POC (<> result, manual entry): >14 % — AB (ref 4.0–5.6)

## 2023-02-09 LAB — CBG MONITORING, ED
Glucose-Capillary: >600 mg/dL (ref 70–99)
Glucose-Capillary: 498 mg/dL — ABNORMAL HIGH (ref 70–99)

## 2023-02-09 LAB — BETA-HYDROXYBUTYRIC ACID: Beta-Hydroxybutyric Acid: 1.04 mmol/L — ABNORMAL HIGH (ref 0.05–0.27)

## 2023-02-09 LAB — OSMOLALITY: Osmolality: 309 mOsm/kg — ABNORMAL HIGH (ref 275–295)

## 2023-02-09 MED ORDER — ENOXAPARIN SODIUM 40 MG/0.4ML IJ SOSY: 40.0000 mg | PREFILLED_SYRINGE | INTRAMUSCULAR | Status: DC

## 2023-02-09 MED ORDER — INSULIN GLARGINE-YFGN 100 UNIT/ML ~~LOC~~ SOLN
20.0000 [IU] | Freq: Every day | SUBCUTANEOUS | Status: DC
  Administered 2023-02-09: 20 [IU] via SUBCUTANEOUS
  Filled 2023-02-09 (×2): qty 0.2

## 2023-02-09 MED ORDER — INSULIN ASPART 100 UNIT/ML IJ SOLN
0.0000 [IU] | Freq: Every day | INTRAMUSCULAR | Status: DC
  Administered 2023-02-09: 3 [IU] via SUBCUTANEOUS
  Administered 2023-02-10: 4 [IU] via SUBCUTANEOUS

## 2023-02-09 MED ORDER — SODIUM CHLORIDE 0.9 % IV BOLUS
1000.0000 mL | Freq: Once | INTRAVENOUS | Status: AC
  Administered 2023-02-09: 1000 mL via INTRAVENOUS

## 2023-02-09 MED ORDER — INSULIN ASPART 100 UNIT/ML IJ SOLN
0.0000 [IU] | Freq: Three times a day (TID) | INTRAMUSCULAR | Status: DC
  Administered 2023-02-10: 11 [IU] via SUBCUTANEOUS
  Administered 2023-02-10: 15 [IU] via SUBCUTANEOUS
  Administered 2023-02-10: 8 [IU] via SUBCUTANEOUS
  Administered 2023-02-11: 5 [IU] via SUBCUTANEOUS
  Administered 2023-02-11: 11 [IU] via SUBCUTANEOUS

## 2023-02-09 MED ORDER — INSULIN GLARGINE-YFGN 100 UNIT/ML ~~LOC~~ SOLN
20.0000 [IU] | Freq: Every day | SUBCUTANEOUS | Status: DC
  Filled 2023-02-09: qty 0.2

## 2023-02-09 MED ORDER — PNEUMOCOCCAL 20-VAL CONJ VACC 0.5 ML IM SUSY
0.5000 mL | PREFILLED_SYRINGE | INTRAMUSCULAR | Status: AC
  Administered 2023-02-10: 0.5 mL via INTRAMUSCULAR
  Filled 2023-02-09: qty 0.5

## 2023-02-09 MED ORDER — INSULIN ASPART 100 UNIT/ML IJ SOLN
10.0000 [IU] | Freq: Once | INTRAMUSCULAR | Status: AC
  Administered 2023-02-09: 10 [IU] via SUBCUTANEOUS

## 2023-02-09 MED ORDER — ENOXAPARIN SODIUM 40 MG/0.4ML IJ SOSY
40.0000 mg | PREFILLED_SYRINGE | INTRAMUSCULAR | Status: DC
  Administered 2023-02-09 – 2023-02-10 (×2): 40 mg via SUBCUTANEOUS
  Filled 2023-02-09 (×2): qty 0.4

## 2023-02-09 MED ORDER — ACETAMINOPHEN 325 MG PO TABS
650.0000 mg | ORAL_TABLET | Freq: Four times a day (QID) | ORAL | Status: DC | PRN
  Administered 2023-02-11: 650 mg via ORAL
  Filled 2023-02-09: qty 2

## 2023-02-09 MED ORDER — ACETAMINOPHEN 650 MG RE SUPP: 650.0000 mg | Freq: Four times a day (QID) | RECTAL | Status: DC | PRN

## 2023-02-09 MED ORDER — INSULIN ASPART 100 UNIT/ML IJ SOLN
15.0000 [IU] | Freq: Once | INTRAMUSCULAR | Status: AC
  Administered 2023-02-09: 15 [IU] via SUBCUTANEOUS

## 2023-02-09 NOTE — ED Notes (Signed)
MD Tegeler at bedside.

## 2023-02-09 NOTE — ED Notes (Signed)
ED TO INPATIENT HANDOFF REPORT  ED Nurse Name and Phone #: Eyal Greenhaw [phone]  S Name/Age/Gender Joshua Petty 57 y.o. male Room/Bed: TRAAC/TRAAC  Code Status   Code Status: Full Code  Home/SNF/Other Home Patient oriented to: self, place, time, and situation Is this baseline? Yes   Triage Complete: Triage complete  Chief Complaint Hyperosmolar hyperglycemic state (HHS) (HCC) [E11.00]  Triage Note Pt came in due to not feeling well since Monday 02/06/23.  Pt has been hypotensive and blood sugar in the 400's.     Allergies No Known Allergies  Level of Care/Admitting Diagnosis ED Disposition     ED Disposition  Admit   Condition  --   Comment  Hospital Area: [facility] [[id number]]  Level of Care: Telemetry Medical [104]  May place patient in observation at [facility] or [facility] if equivalent level of care is available:: No  Covid Evaluation: Asymptomatic - no recent exposure (last 10 days) testing not required  Diagnosis: Hyperosmolar hyperglycemic state (HHS) [facility]) [[id number]]  Admitting Physician: Dickie La [[id number]]  Attending Physician: Dickie La [[id number]]          B Medical/Surgery History Past Medical History:  Diagnosis Date   Diabetes (HCC)    Gonorrhea    Hyperlipidemia    Hypertension    Past Surgical History:  Procedure Laterality Date   NO PAST SURGERIES       A IV Location/Drains/Wounds Patient Lines/Drains/Airways Status     Active Line/Drains/Airways     Name Placement date Placement time Site Days   Peripheral IV 02/09/23 20 G Right Antecubital 02/09/23  1243  Antecubital  less than 1   Incision (Closed) 04/14/16 Scrotum Right 04/14/16  1510  -- 2492            Intake/Output Last 24 hours  Intake/Output Summary (Last 24 hours) at 02/09/2023 1549 Last data filed at 02/09/2023 1425 Gross per 24 hour  Intake 1000 ml  Output --  Net 1000 ml    Labs/Imaging Results for orders placed or performed during  the hospital encounter of 02/09/23 (from the past 48 hour(s))  CBG monitoring, ED     Status: Abnormal   Collection Time: 02/09/23 12:39 PM  Result Value Ref Range   Glucose-Capillary >600 (HH) 70 - 99 mg/dL    Comment: Glucose reference range applies only to samples taken after fasting for at least 8 hours.  CBC with Differential     Status: Abnormal   Collection Time: 02/09/23 12:42 PM  Result Value Ref Range   WBC 8.5 4.0 - 10.5 K/uL   RBC 4.56 4.22 - 5.81 MIL/uL   Hemoglobin 13.9 13.0 - 17.0 g/dL   HCT 03.4 (L) 74.2 - 59.5 %   MCV 85.3 80.0 - 100.0 fL   MCH 30.5 26.0 - 34.0 pg   MCHC 35.7 30.0 - 36.0 g/dL   RDW 63.8 75.6 - 43.3 %   Platelets 303 150 - 400 K/uL   nRBC 0.0 0.0 - 0.2 %   Neutrophils Relative % 67 %   Neutro Abs 5.7 1.7 - 7.7 K/uL   Lymphocytes Relative 25 %   Lymphs Abs 2.1 0.7 - 4.0 K/uL   Monocytes Relative 6 %   Monocytes Absolute 0.5 0.1 - 1.0 K/uL   Eosinophils Relative 1 %   Eosinophils Absolute 0.0 0.0 - 0.5 K/uL   Basophils Relative 1 %   Basophils Absolute 0.1 0.0 - 0.1 K/uL   Immature Granulocytes 0 %  Abs Immature Granulocytes 0.03 0.00 - 0.07 K/uL    Comment: Performed at [facility], [address], [place], [place] [address]  Comprehensive metabolic panel     Status: Abnormal   Collection Time: 02/09/23 12:42 PM  Result Value Ref Range   Sodium 120 (L) 135 - 145 mmol/L   Potassium 4.4 3.5 - 5.1 mmol/L   Chloride 86 (L) 98 - 111 mmol/L   CO2 19 (L) 22 - 32 mmol/L   Glucose, Bld 691 (HH) 70 - 99 mg/dL    Comment: CRITICAL RESULT CALLED TO, READ BACK BY AND VERIFIED WITH R.Alpha Chouinard,RN @1423  02/09/2023 VANG.J Glucose reference range applies only to samples taken after fasting for at least 8 hours.    BUN 36 (H) 6 - 20 mg/dL   Creatinine, Ser 5.28 (H) 0.61 - 1.24 mg/dL   Calcium 9.2 8.9 - 41.3 mg/dL   Total Protein 8.1 6.5 - 8.1 g/dL   Albumin 3.5 3.5 - 5.0 g/dL   AST 25 15 - 41 U/L   ALT 18 0 - 44 U/L   Alkaline Phosphatase 66 38 -  126 U/L   Total Bilirubin 1.1 0.3 - 1.2 mg/dL   GFR, Estimated 46 (L) >60 mL/min    Comment: (NOTE) Calculated using the CKD-EPI Creatinine Equation (2021)    Anion gap 15 5 - 15    Comment: Performed at [facility], [address], [place], [place] [address]  Beta-hydroxybutyric acid     Status: Abnormal   Collection Time: 02/09/23 12:42 PM  Result Value Ref Range   Beta-Hydroxybutyric Acid 1.04 (H) 0.05 - 0.27 mmol/L    Comment: Performed at [facility], [address] [place], [place] [address]  I-Stat CG4 Lactic Acid     Status: Abnormal   Collection Time: 02/09/23  1:07 PM  Result Value Ref Range   Lactic Acid, Venous 2.2 (HH) 0.5 - 1.9 mmol/L   Comment NOTIFIED PHYSICIAN   I-Stat venous blood gas, ([facility], [facility], [facility])     Status: Abnormal   Collection Time: 02/09/23  1:07 PM  Result Value Ref Range   pH, Ven 7.533 (H) 7.25 - 7.43   pCO2, Ven 27.6 (L) 44 - 60 mmHg   pO2, Ven 147 (H) 32 - 45 mmHg   Bicarbonate 23.2 20.0 - 28.0 mmol/L   TCO2 24 22 - 32 mmol/L   O2 Saturation 100 %   Acid-Base Excess 2.0 0.0 - 2.0 mmol/L   Sodium 121 (L) 135 - 145 mmol/L   Potassium 4.6 3.5 - 5.1 mmol/L   Calcium, Ion 1.05 (L) 1.15 - 1.40 mmol/L   HCT 43.0 39.0 - 52.0 %   Hemoglobin 14.6 13.0 - 17.0 g/dL   Sample type VENOUS   I-Stat CG4 Lactic Acid     Status: Abnormal   Collection Time: 02/09/23  2:35 PM  Result Value Ref Range   Lactic Acid, Venous 3.1 (HH) 0.5 - 1.9 mmol/L   Comment NOTIFIED PHYSICIAN   Urinalysis, w/ Reflex to Culture (Infection Suspected) -Urine, Clean Catch     Status: Abnormal   Collection Time: 02/09/23  2:50 PM  Result Value Ref Range   Specimen Source URINE, CLEAN CATCH    Color, Urine YELLOW YELLOW   APPearance CLEAR CLEAR   Specific Gravity, Urine 1.021 1.005 - 1.030   pH 5.0 5.0 - 8.0   Glucose, UA >=500 (A) NEGATIVE mg/dL   Hgb urine dipstick NEGATIVE NEGATIVE  Bilirubin Urine NEGATIVE NEGATIVE   Ketones, ur 5 (A) NEGATIVE mg/dL    Protein, ur NEGATIVE NEGATIVE mg/dL   Nitrite NEGATIVE NEGATIVE   Leukocytes,Ua NEGATIVE NEGATIVE   RBC / HPF 0-5 0 - 5 RBC/hpf   WBC, UA 0-5 0 - 5 WBC/hpf    Comment:        Reflex urine culture not performed if WBC <=10, OR if Squamous epithelial cells >5. If Squamous epithelial cells >5 suggest recollection.    Bacteria, UA NONE SEEN NONE SEEN   Squamous Epithelial / HPF 0-5 0 - 5 /HPF    Comment: Performed at [facility], [address], [place], [place] [address]   DG Chest Portable 1 View  Result Date: 02/09/2023 CLINICAL DATA:  Hyperglycemia EXAM: PORTABLE CHEST 1 VIEW COMPARISON:  None Available. FINDINGS: The heart size and mediastinal contours are within normal limits. Both lungs are clear. No consolidation, pneumothorax or effusion. No edema. Degenerative changes of the spine. Overlapping cardiac leads. IMPRESSION: No acute cardiopulmonary disease. Electronically Signed   By: Karen Kays M.D.   On: 02/09/2023 14:10    Pending Labs Unresulted Labs (From admission, onward)     Start     Ordered   02/10/23 0500  HIV Antibody (routine testing w rflx)  (HIV Antibody (Routine testing w reflex) panel)  Tomorrow morning,   R        02/09/23 1519   02/10/23 0500  Basic metabolic panel  Tomorrow morning,   R        02/09/23 1519   02/10/23 0500  CBC  Tomorrow morning,   R        02/09/23 1519   02/09/23 1800  Basic metabolic panel  Daily,   R        02/09/23 1543   02/09/23 1541  Phosphorus  Add-on,   AD        02/09/23 1540   02/09/23 1541  Osmolality  Once,   R        02/09/23 1540            Vitals/Pain Today's Vitals   02/09/23 1315 02/09/23 1330 02/09/23 1445 02/09/23 1500  BP: 95/79 101/81 115/76 103/80  Pulse: 79 80 76 79  Resp: 11 16 16 19   Temp:      TempSrc:      SpO2: 100% 100% 100% 100%  PainSc:        Isolation Precautions No active isolations  Medications Medications  acetaminophen (TYLENOL) tablet 650 mg (has no administration in time  range)    Or  acetaminophen (TYLENOL) suppository 650 mg (has no administration in time range)  insulin aspart (novoLOG) injection 10 Units (has no administration in time range)  enoxaparin (LOVENOX) injection 40 mg (has no administration in time range)  sodium chloride 0.9 % bolus 1,000 mL (has no administration in time range)  insulin aspart (novoLOG) injection 0-15 Units (has no administration in time range)  insulin aspart (novoLOG) injection 0-5 Units (has no administration in time range)  insulin glargine-yfgn (SEMGLEE) injection 20 Units (has no administration in time range)  sodium chloride 0.9 % bolus 1,000 mL (0 mLs Intravenous Stopped 02/09/23 1425)    Mobility walks     Focused Assessments    R Recommendations: See Admitting Provider Note  Report given to:   Additional Notes:

## 2023-02-09 NOTE — Patient Instructions (Signed)
Thank you, [name] for allowing [place] to provide your care today. Today we discussed  -Plan to be further evaluated at Emergency Department today.    -Send prescription for Lantus (insulin pen) 20 units a day. -Send glucose meter kit and supplies. Check blood sugar daily (morning before food).   I have ordered the following labs for you:  Lab Orders         Glucose, capillary         BMP w Anion Gap (STAT/Sunquest-performed on-site)         POC Hbg A1C         POCT Urinalysis Dipstick (54098)      Follow up:  Early next week    Should you have any questions or concerns please call the internal medicine clinic at [phone].    Joshua Petty, D.O. [facility]

## 2023-02-09 NOTE — Hospital Course (Addendum)
Joshua Petty is a 57 yo male with T2DM not currently on insulin who presented to the [facility] today with abdominal pain, malaise, nausea, and decreased PO intake.   POC glucose was >600 and U/A was positive for glucose, negative for ketones. Orthostatic blood pressures were obtained and SBP dropped to the 70s when going from lying to sitting, so standing was deferred. BMP was also obtained and showed a Cr of 1.82, HCO3 of 21, and K of 4.6 - the patient was sent to the ED for further management of his severe hyperglycemia and dehydration.   The patient states that he has been feeling bad/weak/fatigued since July 4th. He has not been eating/drinking well, has been urinating quite frequently, and notes that when he stands up he gets dizzy. He denies any LOC or falls. He has been feeling worse since then. The patient also has been experiencing numbness/tingling in his toes.  Feels nauseous, but denies any vomiting.   Denies any recent illness.   PMHx: T2DM, HTN  Meds: 70/30 insulin 20 units (has not taken since July 4) Amlodipine 5 mg daily Jardiance - off of this Lisinopril-hydrochlorothiazide 20-12.5 mg bid Metformin 1000 mg BID - not since July 4 Crestor - stopped this    Fam Hx: Cardiovascular disease (myocardial infarctions) Diabetes  Social Hx: PCP: [facility] Lives in [place] alone Unemployed Smokes marijuana. No tobacco. No alcohol   Joshua Petty is a 57 y.o. with pertinent PMH of DM2 on insulin, HTN, HLD who presented with 3 weeks of lethargy, polyuria, nausea, increased thirst, and orthostatic hypotension and admitted for HHS.  #HHS #Type II Diabetes Patient presented with a blood sugar over 600 with an osmolality of 309. Slight ketones in blood and urine and a gap of 15. He had not been taking his insulin or metformin for the past month because he thought he was hypoglycemic. He reports he was only taking his insulin about twice a week before then. Patient was given 2 boluses  of NS when he presented to hospital and given 20 units long acting insulin glargine and sliding scale insulin aspart. Blood sugar was still elevated on hospital day 2 so insulin glargine increased to 24. He was also started on mealtime insulin ------. His blood sugar improved and his anion gap improved to -----. Potassium and other electrolytes improved without replacement. His symptoms were improved at time of discharge.  #Orthostatic Hypotension Patient reports lightheadedness upon standing for the past month, likely in the setting of dehydration. His orthostatics were positive in the clinic before he came to the hospital. BP meds were held. Orthostatic hypotension still present with ~ 45 point change in systolic from lying to standing on hospital day 2 so IVF bolus given. Orthostatic hypotension was resolved at time of discharge.  #HLD Patient was formerly on Crestor but stopped because he said it made him constipated. His last lipid panel in 2021 showed LDL of 78. Not an acute problem and can follow up outpatient.

## 2023-02-09 NOTE — ED Triage Notes (Signed)
Pt came in due to not feeling well since Monday 02/06/23.  Pt has been hypotensive and blood sugar in the 400's.

## 2023-02-09 NOTE — ED Provider Notes (Signed)
[place] EMERGENCY DEPARTMENT AT [facility] Provider Note   CSN: [id number] Arrival date & time: 02/09/23  1220     History  Chief Complaint  Patient presents with   Hypotension    Joshua Petty is a 57 y.o. male.  The history is provided by the patient and medical records. No language interpreter was used.  Hyperglycemia Blood sugar level PTA:  700's Severity:  Severe Onset quality:  Unable to specify Timing:  Unable to specify Progression:  Unable to specify Chronicity:  Chronic Diabetes status:  Unable to specify Current diabetic therapy:  Not on glucose meds this month Relieved by:  Nothing Ineffective treatments:  None tried Associated symptoms: dehydration, fatigue, nausea and polyuria   Associated symptoms: no abdominal pain, no chest pain, no confusion, no diaphoresis, no dizziness, no dysuria, no fever, no malaise, no shortness of breath, no vomiting and no weakness        Home Medications Prior to Admission medications   Medication Sig Start Date End Date Taking? Authorizing Provider  amLODipine (NORVASC) 5 MG tablet Take 1 tablet (5 mg total) by mouth daily. 02/25/22   Steffanie Rainwater, MD  Blood Glucose Monitoring Suppl (TRUE METRIX METER) w/Device KIT If you are injecting insulin you MUST check your blood sugar. If <100 do not inject insulin 01/26/22   Masters, Florentina Addison, DO  empagliflozin (JARDIANCE) 10 MG TABS tablet Take 1 tablet (10 mg total) by mouth daily before breakfast. 02/23/22   Steffanie Rainwater, MD  glucose blood (TRUE METRIX BLOOD GLUCOSE TEST) test strip If you are injecting insulin you MUST check your blood sugar. If <100 do not inject insulin 01/19/22   Masters, Florentina Addison, DO  glucose blood test strip Use as instructed 01/26/22   Masters, Florentina Addison, DO  Insulin Pen Needle 31G X 5 MM MISC Use daily as directed 05/06/21   Ellison Carwin, MD  lisinopril-hydrochlorothiazide (ZESTORETIC) 20-12.5 MG tablet Take 2 tablets by mouth once daily 12/15/22    Nooruddin, Jason Fila, MD  metFORMIN (GLUCOPHAGE) 1000 MG tablet TAKE 1 TABLET BY MOUTH TWICE DAILY WITH A MEAL 12/15/22   Nooruddin, Jason Fila, MD  rosuvastatin (CRESTOR) 10 MG tablet Take 1 tablet (10 mg total) by mouth daily. 02/23/22 02/23/23  Steffanie Rainwater, MD  TRUEplus Lancets 30G MISC If you are injecting insulin you MUST check your blood sugar. If <100 do not inject insulin 01/26/22   Masters, Florentina Addison, DO      Allergies    Patient has no known allergies.    Review of Systems   Review of Systems  Constitutional:  Positive for fatigue. Negative for chills, diaphoresis and fever.  HENT:  Negative for congestion.   Respiratory:  Negative for cough, chest tightness, shortness of breath and wheezing.   Cardiovascular:  Negative for chest pain and palpitations.  Gastrointestinal:  Positive for diarrhea and nausea. Negative for abdominal pain, constipation and vomiting.  Endocrine: Positive for polyuria.  Genitourinary:  Negative for dysuria and flank pain.  Musculoskeletal:  Positive for back pain (chronic per pt). Negative for neck pain and neck stiffness.  Skin:  Negative for rash and wound.  Neurological:  Negative for dizziness, weakness, light-headedness and headaches.  Psychiatric/Behavioral:  Negative for agitation and confusion.   All other systems reviewed and are negative.   Physical Exam Updated Vital Signs BP 98/69   Pulse 83   Temp 98.4 F (36.9 C) (Oral)   Resp (!) 23   SpO2 100%  Physical Exam Vitals and  nursing note reviewed.  Constitutional:      General: He is not in acute distress.    Appearance: He is well-developed. He is not ill-appearing, toxic-appearing or diaphoretic.  HENT:     Head: Normocephalic and atraumatic.     Nose: No rhinorrhea.     Mouth/Throat:     Mouth: Mucous membranes are dry.  Eyes:     Extraocular Movements: Extraocular movements intact.     Conjunctiva/sclera: Conjunctivae normal.     Pupils: Pupils are equal, round, and reactive to  light.  Cardiovascular:     Rate and Rhythm: Normal rate and regular rhythm.     Pulses: Normal pulses.     Heart sounds: No murmur heard. Pulmonary:     Effort: Pulmonary effort is normal. No respiratory distress.     Breath sounds: Normal breath sounds. No wheezing, rhonchi or rales.  Chest:     Chest wall: No tenderness.  Abdominal:     General: Abdomen is flat.     Palpations: Abdomen is soft.     Tenderness: There is no abdominal tenderness. There is no right CVA tenderness, left CVA tenderness, guarding or rebound.  Musculoskeletal:        General: No swelling or tenderness.     Cervical back: Neck supple. No tenderness.  Skin:    General: Skin is warm and dry.     Capillary Refill: Capillary refill takes less than 2 seconds.     Findings: No erythema or rash.  Neurological:     General: No focal deficit present.     Mental Status: He is alert.  Psychiatric:        Mood and Affect: Mood normal.     ED Results / Procedures / Treatments   Labs (all labs ordered are listed, but only abnormal results are displayed) Labs Reviewed  CBC WITH DIFFERENTIAL/PLATELET - Abnormal; Notable for the following components:      Result Value   HCT 38.9 (*)    All other components within normal limits  COMPREHENSIVE METABOLIC PANEL - Abnormal; Notable for the following components:   Sodium 120 (*)    Chloride 86 (*)    CO2 19 (*)    Glucose, Bld 691 (*)    BUN 36 (*)    Creatinine, Ser 1.72 (*)    GFR, Estimated 46 (*)    All other components within normal limits  BETA-HYDROXYBUTYRIC ACID - Abnormal; Notable for the following components:   Beta-Hydroxybutyric Acid 1.04 (*)    All other components within normal limits  URINALYSIS, W/ REFLEX TO CULTURE (INFECTION SUSPECTED) - Abnormal; Notable for the following components:   Glucose, UA >=500 (*)    Ketones, ur 5 (*)    All other components within normal limits  CBG MONITORING, ED - Abnormal; Notable for the following components:    Glucose-Capillary >600 (*)    All other components within normal limits  I-STAT CG4 LACTIC ACID, ED - Abnormal; Notable for the following components:   Lactic Acid, Venous 2.2 (*)    All other components within normal limits  I-STAT VENOUS BLOOD GAS, ED - Abnormal; Notable for the following components:   pH, Ven 7.533 (*)    pCO2, Ven 27.6 (*)    pO2, Ven 147 (*)    Sodium 121 (*)    Calcium, Ion 1.05 (*)    All other components within normal limits  I-STAT CG4 LACTIC ACID, ED - Abnormal; Notable for the following components:  Lactic Acid, Venous 3.1 (*)    All other components within normal limits  PHOSPHORUS  OSMOLALITY  BASIC METABOLIC PANEL    EKG None  Radiology DG Chest Portable 1 View  Result Date: 02/09/2023 CLINICAL DATA:  Hyperglycemia EXAM: PORTABLE CHEST 1 VIEW COMPARISON:  None Available. FINDINGS: The heart size and mediastinal contours are within normal limits. Both lungs are clear. No consolidation, pneumothorax or effusion. No edema. Degenerative changes of the spine. Overlapping cardiac leads. IMPRESSION: No acute cardiopulmonary disease. Electronically Signed   By: Karen Kays M.D.   On: 02/09/2023 14:10    Procedures Procedures    Medications Ordered in ED Medications  acetaminophen (TYLENOL) tablet 650 mg (has no administration in time range)    Or  acetaminophen (TYLENOL) suppository 650 mg (has no administration in time range)  insulin aspart (novoLOG) injection 10 Units (has no administration in time range)  enoxaparin (LOVENOX) injection 40 mg (has no administration in time range)  sodium chloride 0.9 % bolus 1,000 mL (has no administration in time range)  insulin aspart (novoLOG) injection 0-15 Units (has no administration in time range)  insulin aspart (novoLOG) injection 0-5 Units (has no administration in time range)  insulin glargine-yfgn (SEMGLEE) injection 20 Units (has no administration in time range)  sodium chloride 0.9 % bolus 1,000  mL (0 mLs Intravenous Stopped 02/09/23 1425)    ED Course/ Medical Decision Making/ A&P                             Medical Decision Making Amount and/or Complexity of Data Reviewed Labs: ordered. Radiology: ordered.  Risk Decision regarding hospitalization.    Reign Bartnick is a 57 y.o. male with a past medical history significant for diabetes, hypertension, hyperlipidemia, CKD, and obesity who presents from internal medicine clinic for hyperglycemia and hypotension.  According to patient, for the last 4 days, he has had nausea, loose stools, fatigue, and polyuria.  He is feeling bad and tired with malaise and went to the doctor today.  He was told that his glucose was in the 700s and he needed to come immediately to the emergency department downstairs.  He was found to have a blood pressure in the 70s and was brought down immediately.  Patient reports he is feeling fatigued but denies any syncope.  He denies chest pain, palpitations, shortness of breath.  Denies any fevers, chills, congestion, or cough.  Denies any chest pain or shortness of breath.  Denies any vomiting but does have nausea.  Denies abdominal pain.  Reports he is peeing more but denies dysuria.  Denies any constipation he does have some loose stools for the last few days.  Denies any sick contacts.  Denies any URI symptoms.  Reports he has been admitted for hyperglycemia in the past.  Of note, he says that for the last month or so he has been off of his glucose medications.  He could not tell me why.  Review shows that patient was found to be hyperglycemic with glucose in the 700s in clinic.  We will repeat labs to see if he is in DKA.  Will start with some fluids as he does have dry mucous membranes and is still hypotensive in the 80s.  Will get workup to look for occult infection in the urine or chest and due to his hypoglycemia and likely dehydration and suspected AKI, anticipate he will likely need admission by  internal medicine for  further management.  Will call internal medicine after workup is further along.                  Internal medicine team will come see for admission, still waiting on rest of labs to be completed.  Initially he is not acidotic so doubt DKA.         Final Clinical Impression(s) / ED Diagnoses Final diagnoses:  Hyperglycemia   Clinical Impression: 1. Hyperglycemia     Disposition: Admit  This note was prepared with assistance of Dragon voice recognition software. Occasional wrong-word or sound-a-like substitutions may have occurred due to the inherent limitations of voice recognition software.      Talli Kimmer, Canary Brim, MD 02/09/23 [phone]

## 2023-02-09 NOTE — Plan of Care (Signed)

## 2023-02-09 NOTE — Progress Notes (Signed)
CC: Feeling unwell  HPI:  JoshuaJoshua Petty is a 57 y.o. male living with a history stated below and presents today for feeling unwell. Please see problem based assessment and plan for additional details.  Past Medical History:  Diagnosis Date   Diabetes (HCC)    Gonorrhea    Hyperlipidemia    Hypertension     Current Outpatient Medications on File Prior to Visit  Medication Sig Dispense Refill   amLODipine (NORVASC) 5 MG tablet Take 1 tablet (5 mg total) by mouth daily. (Patient not taking: Reported on 02/09/2023) 30 tablet 11   Blood Glucose Monitoring Suppl (TRUE METRIX METER) w/Device KIT If you are injecting insulin you MUST check your blood sugar. If <100 do not inject insulin 1 kit 0   empagliflozin (JARDIANCE) 10 MG TABS tablet Take 1 tablet (10 mg total) by mouth daily before breakfast. (Patient not taking: Reported on 02/09/2023) 30 tablet 5   glucose blood (TRUE METRIX BLOOD GLUCOSE TEST) test strip If you are injecting insulin you MUST check your blood sugar. If <100 do not inject insulin 100 each 2   glucose blood test strip Use as instructed 100 each 12   Insulin Pen Needle 31G X 5 MM MISC Use daily as directed 100 each 3   lisinopril-hydrochlorothiazide (ZESTORETIC) 20-12.5 MG tablet Take 2 tablets by mouth once daily 180 tablet 0   metFORMIN (GLUCOPHAGE) 1000 MG tablet TAKE 1 TABLET BY MOUTH TWICE DAILY WITH A MEAL 180 tablet 0   rosuvastatin (CRESTOR) 10 MG tablet Take 1 tablet (10 mg total) by mouth daily. (Patient not taking: Reported on 02/09/2023) 90 tablet 3   TRUEplus Lancets 30G MISC If you are injecting insulin you MUST check your blood sugar. If <100 do not inject insulin 100 each 2   No current facility-administered medications on file prior to visit.   Review of Systems: ROS negative except for what is noted on the assessment and plan.  Vitals:   02/09/23 0957  BP: 100/65  Pulse: (!) 102  Temp: (!) 97.5 F (36.4 C)  TempSrc: Oral  SpO2: 99%   Weight: 219 lb 1.6 oz (99.4 kg)   Physical Exam: Constitutional: alert, non-diaphoretic male sitting in chair, in no acute distress HENT: normocephalic atraumatic Cardiovascular: borderline tachycardia, normal rhythm, palpable DP pulse bilaterally Pulmonary/Chest: non-labored, on room air, lungs CTAB Abdominal: bowel sounds present, soft, non-distended, minimal TTP without guarding or rebound Neurological: alert & oriented x 3 Skin: warm and dry Psych: pleasant mood but at times tearful   Assessment & Plan:   Weight loss Weight decreased today to 219 lbs from 229 lbs earlier this month. Remains poor appetite and decreased po intake. Uncontrolled diabetes with glucose 743, A1c >14% but no ketones in POC urine dipstick today. Wonder with severe hyperglycemia and poorly controlled T2DM if this is contributing to weight loss.   Uncontrolled type 2 diabetes mellitus with hyperosmolar nonketotic hyperglycemia (HCC) Poorly controlled. A1c >14% with glucose 743. Negative ketones on POC urine dipstick. Patient endorses generalized weakness and malaise. Was on metformin 1000 mg BID and Jardiance 10 mg but has not taken those recently. Was on insulin 70/30 before several years ago but tapered off.   Also found to be hypotension with positive orthostatics.   Plan -Sent to ED for further evaluation   Orthostatic hypotension Presents today with several concerns. Still has poor appetite but able to tolerate some po intake. Has not taken most of his home medications except he states  taking lisinopril-hydrochlorothiazide still. For past 1-2 weeks, patient has noted lightheadedness with positional changes. Notes low back spasms. Abdominal pain has improved since prior OV but mostly describes as hunger pains. Has nausea but no vomiting. Denies urinary symptoms, diarrhea, fever. No recent sick contacts.   Orthostatic vitals positive: lying BP 96/62 to sitting BP 76/59. Provided water bottles in clinic but  warrant further evaluation.   Plan -Send to ED for further evaluation and IV fluids -Hold home lisinopril-hydrochlorothiazide, amlodipine, metformin and Jardiance   Stage 3a chronic kidney disease (CKD) (HCC) STAT BMP showed creatinine 1.82 today. Last cre 1.49 in 01/2022. Not exactly sure of baseline but mainly around 1.5-1.7. Has had poor appetite and po intake for past few weeks.   I spoke over the phone with ED Triage Nurse Eileen Stanford about transferring patient to ED.    Patient seen with Dr. Halina Andreas, D.O. [facility] Internal Medicine, PGY-2 Phone: [phone] Date 02/09/2023 Time 2:36 PM

## 2023-02-09 NOTE — Assessment & Plan Note (Signed)
Weight decreased today to 219 lbs from 229 lbs earlier this month. Remains poor appetite and decreased po intake. Uncontrolled diabetes with glucose 743, A1c >14% but no ketones in POC urine dipstick today. Wonder with severe hyperglycemia and poorly controlled T2DM if this is contributing to weight loss.

## 2023-02-09 NOTE — Assessment & Plan Note (Signed)
STAT BMP showed creatinine 1.82 today. Last cre 1.49 in 01/2022. Not exactly sure of baseline but mainly around 1.5-1.7. Has had poor appetite and po intake for past few weeks.

## 2023-02-09 NOTE — Assessment & Plan Note (Signed)
Poorly controlled. A1c >14% with glucose 743. Negative ketones on POC urine dipstick. Patient endorses generalized weakness and malaise. Was on metformin 1000 mg BID and Jardiance 10 mg but has not taken those recently. Was on insulin 70/30 before several years ago but tapered off.   Also found to be hypotension with positive orthostatics.   Plan -Sent to ED for further evaluation

## 2023-02-09 NOTE — Progress Notes (Signed)
57yo man with T2DM no currently on insulin presents with persistent abdominal pain, malaise, nausea, and decreased PO intake.   POC glucose >600. POC U/A without ketones.  Orthostatic blood pressure - SBP dropped to 70s when going from lying to sitting, so standing was deferred  Cr 1.82, HCO3 21, K 4.6   I am concerned that he is severely hyperglycemic and dehydrated, at very high risk for DKA. Will send to ER for IVF, insulin, and further evaluation.   If he does not need admission, Dr. Sherrilee Gilles will send him lantus 20 units daily + insulin supplies to our pharmacy. He should stop his BP meds, metformin, jardiance until PCP follow up next week  If admitted, should admit to IMTS. Dr Sherrilee Gilles to give signout to [facility] team.  Patient lives alone. He has been on insulin before but not lately.

## 2023-02-09 NOTE — Assessment & Plan Note (Signed)
Presents today with several concerns. Still has poor appetite but able to tolerate some po intake. Has not taken most of his home medications except he states taking lisinopril-hydrochlorothiazide still. For past 1-2 weeks, patient has noted lightheadedness with positional changes. Notes low back spasms. Abdominal pain has improved since prior OV but mostly describes as hunger pains. Has nausea but no vomiting. Denies urinary symptoms, diarrhea, fever. No recent sick contacts.   Orthostatic vitals positive: lying BP 96/62 to sitting BP 76/59. Provided water bottles in clinic but warrant further evaluation.   Plan -Send to ED for further evaluation and IV fluids -Hold home lisinopril-hydrochlorothiazide, amlodipine, metformin and Jardiance

## 2023-02-09 NOTE — H&P (Addendum)
Date: 02/09/2023               Patient Name:  Joshua Petty MRN: [id number]  DOB: 1965-09-09 Age / Sex: 57 y.o., male   PCP: Olegario Messier, MD         Medical Service: Internal Medicine Teaching Service         Attending Physician: Dr. Dickie La, MD    First Contact: Randell Patient, MS3 Pager: [phone]  Second Contact: Elza Rafter, DO Pager: [phone]       After Hours (After 5p/  First Contact Pager: [phone]  weekends / holidays): Second Contact Pager: [phone]   SUBJECTIVE   Chief Complaint: Lethargy  History of Present Illness:  Joshua Petty is a 57 yo male with PMH of DM2 and HTN who presents from the [facility] to the ED with 3 weeks of lethargy, polyuria, nausea, increased thirst, and lightheadedness upon standing. His symptoms started around July 4th. He thought that his symptoms were due to hypoglycemia so he stopped taking his diabetes medications. He says that his medications are not too expensive. His symptoms have since worsened. He says around July 4th, he had a "head cold" with fever, headache, and runny nose, but those symptoms resolved. His vision has been getting worse recently and he has seen an eye doctor. He also says that his feet have felt cold recently but he can still feel them. He denies loss of sensation in his hands. He endorses muscle spasms in his back. He denies any chest pain, SOB, LOC. At the [facility] today, POC glucose was >600 and U/A was positive for glucose, negative for ketones. Orthostatic blood pressures were obtained and SBP dropped to the 70s when going from lying to sitting, so standing was deferred. BMP was also obtained and showed a Cr of 1.82, HCO3 of 21, and K of 4.6. The patient was sent to the ED for further management of his severe hyperglycemia and dehydration.   Meds:  70/30 insulin 20 units (has not taken since July 4) Amlodipine 5 mg daily Jardiance - off of this Lisinopril-hydrochlorothiazide 20-12.5 mg bid Metformin 1000 mg BID - not  since July 4 Crestor - stopped this   PMH Type II Diabetes Hyperlipidemia Hypertension  Surgical History No past surgeries  Social:  Lives alone in [place] Occupation: unemployed Level of Function: Independent of ADLs and IADLs PCP: Saad Nooruddin Substances: smokes marijuana, no alcohol, no tobacco, no other substances  Family History:  Cardiovascular disease (myocardial infarctions) Diabetes  Allergies: NKA  Review of Systems: A complete ROS was negative except as per HPI.   OBJECTIVE:   Physical Exam: Blood pressure 103/80, pulse 79, temperature 98.4 F (36.9 C), temperature source Oral, resp. rate 19, SpO2 100%.  Constitutional: well-appearing, laying in bed, in no acute distress HENT: normocephalic atraumatic Cardiovascular: regular rate and rhythm, no m/r/g Pulmonary/Chest: normal work of breathing on room air, lungs clear to auscultation bilaterally Abdominal: soft, non-tender, non-distended Ext: skin is warm, no edema, dorsal pedal pulses palpable, sensation intact Psych: Normal mood and affect  Labs: CBC    Component Value Date/Time   WBC 8.5 02/09/2023 1242   RBC 4.56 02/09/2023 1242   HGB 14.6 02/09/2023 1307   HCT 43.0 02/09/2023 1307   PLT 303 02/09/2023 1242   MCV 85.3 02/09/2023 1242   MCH 30.5 02/09/2023 1242   MCHC 35.7 02/09/2023 1242   RDW 11.8 02/09/2023 1242   LYMPHSABS 2.1 02/09/2023 1242   MONOABS 0.5 02/09/2023 1242  EOSABS 0.0 02/09/2023 1242   BASOSABS 0.1 02/09/2023 1242     CMP     Component Value Date/Time   NA 121 (L) 02/09/2023 1307   NA 137 05/06/2021 1008   K 4.6 02/09/2023 1307   CL 86 (L) 02/09/2023 1242   CO2 19 (L) 02/09/2023 1242   GLUCOSE 691 (HH) 02/09/2023 1242   BUN 36 (H) 02/09/2023 1242   BUN 28 (H) 05/06/2021 1008   CREATININE 1.72 (H) 02/09/2023 1242   CALCIUM 9.2 02/09/2023 1242   PROT 8.1 02/09/2023 1242   ALBUMIN 3.5 02/09/2023 1242   AST 25 02/09/2023 1242   ALT 18 02/09/2023 1242    ALKPHOS 66 02/09/2023 1242   BILITOT 1.1 02/09/2023 1242   GFRNONAA 46 (L) 02/09/2023 1242   GFRAA 52 (L) 08/26/2019 1417   Urinalysis Glucose >500, Ketones 5 mg/dL, no bacteria, 0-5 WBC  Imaging: DG Chest Portable 1 View  Result Date: 02/09/2023 CLINICAL DATA:  Hyperglycemia EXAM: PORTABLE CHEST 1 VIEW COMPARISON:  None Available. FINDINGS: The heart size and mediastinal contours are within normal limits. Both lungs are clear. No consolidation, pneumothorax or effusion. No edema. Degenerative changes of the spine. Overlapping cardiac leads. IMPRESSION: No acute cardiopulmonary disease. Electronically Signed   By: Karen Kays M.D.   On: 02/09/2023 14:10    EKG: personally reviewed my interpretation is normal sinus rhythm   ASSESSMENT & PLAN:    Assessment & Plan by Problem: Principal Problem:   Hyperosmolar hyperglycemic state (HHS) (HCC)   Joshua Petty is a 57 y.o. with pertinent PMH of DM2 on insulin, HTN, HLD who presented with 3 weeks of lethargy, polyuria, nausea, increased thirst, and orthostatic hypotension and admitted for hyperglycemia with ketosis on hospital day 0  #Severe Hyperglycemia #Concern for HHS #Type II Diabetes #Electrolyte Derangement #Respiratory Alkalosis Patient has not taken his diabetes medications for several weeks and his blood sugar was over 600. He had slightly low bicarb with increased anion gap in the clinic. B hydroxybutyrate was slightly elevated to 1.04 and urinalysis showed minimal ketones at 5 mg/dL. VBG in ED shows pH of 7.53 with pCO2 of 27.6 and bicarb of 23.2. Serum osmolality elevated at 309. Urine Labs are not consistent with DKA or HHS at this time, however he has some ketosis and a respiratory alkalosis. BUN/cre ration is >20 consistent with a prerenal AKI likely due to dehydration 2/2 hyperglycemia. Giving NS to rehydrate and insulin to correct hyperglycemia. If bicarb is decreased on next BMP can start endotool but will treat with  subcutaneous insulin for now. No need to give potassium at this time but monitoring BMP. -IVF with NS -insulin glargine 20 units PID -SSI insulin aspart -BMP -CBG monitoring -Telemetry  #Orthostatic hypotension I expect this is secondary to his dehydration and should correct with IVF/rehydration. -Hold lisinopril-hydrochlorothiazide  -IVF  #HLD Not currently taking statin as he says it made him constipated. Last lipid panel was several years ago. Can reevaluate outpatient.  Diet: Carb-Modified VTE: Enoxaparin IVF: NS, bolus 1000 mL x 2, then 192mL/hr Code: Full  Prior to Admission Living Arrangement: Home, living alone Anticipated Discharge Location: Home Barriers to Discharge: glycemic control  Dispo: Admit patient to Observation with expected length of stay less than 2 midnights.  Signed: Barrett Shell, Medical Student Internal Medicine Resident PGY-1 Pager: [phone]  02/09/2023, 3:44 PM    Attestation for Student Documentation:  I personally was present and performed or re-performed the history, physical exam and medical decision-making activities  of this service and have verified that the service and findings are accurately documented in the student's note.  Morrie Sheldon, MD 02/09/2023, 5:06 PM

## 2023-02-09 NOTE — ED Notes (Signed)
Help get patient into a gown on the monitor patient is resting with nurse at bedside

## 2023-02-10 DIAGNOSIS — I951 Orthostatic hypotension: Secondary | ICD-10-CM

## 2023-02-10 DIAGNOSIS — E11319 Type 2 diabetes mellitus with unspecified diabetic retinopathy without macular edema: Secondary | ICD-10-CM | POA: Diagnosis present

## 2023-02-10 DIAGNOSIS — I129 Hypertensive chronic kidney disease with stage 1 through stage 4 chronic kidney disease, or unspecified chronic kidney disease: Secondary | ICD-10-CM | POA: Diagnosis present

## 2023-02-10 DIAGNOSIS — F1290 Cannabis use, unspecified, uncomplicated: Secondary | ICD-10-CM | POA: Diagnosis present

## 2023-02-10 DIAGNOSIS — E860 Dehydration: Secondary | ICD-10-CM | POA: Diagnosis present

## 2023-02-10 DIAGNOSIS — G8929 Other chronic pain: Secondary | ICD-10-CM | POA: Diagnosis present

## 2023-02-10 DIAGNOSIS — E1100 Type 2 diabetes mellitus with hyperosmolarity without nonketotic hyperglycemic-hyperosmolar coma (NKHHC): Secondary | ICD-10-CM | POA: Diagnosis present

## 2023-02-10 DIAGNOSIS — T383X6A Underdosing of insulin and oral hypoglycemic [antidiabetic] drugs, initial encounter: Secondary | ICD-10-CM | POA: Diagnosis present

## 2023-02-10 DIAGNOSIS — E1165 Type 2 diabetes mellitus with hyperglycemia: Principal | ICD-10-CM

## 2023-02-10 DIAGNOSIS — K5900 Constipation, unspecified: Secondary | ICD-10-CM | POA: Diagnosis present

## 2023-02-10 DIAGNOSIS — M62830 Muscle spasm of back: Secondary | ICD-10-CM | POA: Diagnosis present

## 2023-02-10 DIAGNOSIS — E873 Alkalosis: Secondary | ICD-10-CM | POA: Diagnosis present

## 2023-02-10 DIAGNOSIS — N179 Acute kidney failure, unspecified: Secondary | ICD-10-CM | POA: Diagnosis present

## 2023-02-10 DIAGNOSIS — M25562 Pain in left knee: Secondary | ICD-10-CM | POA: Diagnosis present

## 2023-02-10 DIAGNOSIS — Z8249 Family history of ischemic heart disease and other diseases of the circulatory system: Secondary | ICD-10-CM | POA: Diagnosis not present

## 2023-02-10 DIAGNOSIS — M62838 Other muscle spasm: Secondary | ICD-10-CM | POA: Diagnosis present

## 2023-02-10 DIAGNOSIS — N1831 Chronic kidney disease, stage 3a: Secondary | ICD-10-CM | POA: Diagnosis present

## 2023-02-10 DIAGNOSIS — Z23 Encounter for immunization: Secondary | ICD-10-CM | POA: Diagnosis not present

## 2023-02-10 DIAGNOSIS — Z794 Long term (current) use of insulin: Secondary | ICD-10-CM

## 2023-02-10 DIAGNOSIS — Z91128 Patient's intentional underdosing of medication regimen for other reason: Secondary | ICD-10-CM | POA: Diagnosis not present

## 2023-02-10 DIAGNOSIS — Z833 Family history of diabetes mellitus: Secondary | ICD-10-CM | POA: Diagnosis not present

## 2023-02-10 DIAGNOSIS — E785 Hyperlipidemia, unspecified: Secondary | ICD-10-CM | POA: Diagnosis present

## 2023-02-10 DIAGNOSIS — H538 Other visual disturbances: Secondary | ICD-10-CM | POA: Diagnosis present

## 2023-02-10 DIAGNOSIS — Y92009 Unspecified place in unspecified non-institutional (private) residence as the place of occurrence of the external cause: Secondary | ICD-10-CM | POA: Diagnosis not present

## 2023-02-10 DIAGNOSIS — R739 Hyperglycemia, unspecified: Secondary | ICD-10-CM | POA: Diagnosis present

## 2023-02-10 DIAGNOSIS — Z560 Unemployment, unspecified: Secondary | ICD-10-CM | POA: Diagnosis not present

## 2023-02-10 DIAGNOSIS — E1122 Type 2 diabetes mellitus with diabetic chronic kidney disease: Secondary | ICD-10-CM | POA: Diagnosis present

## 2023-02-10 LAB — LACTIC ACID, PLASMA
Lactic Acid, Venous: 1.1 mmol/L (ref 0.5–1.9)
Lactic Acid, Venous: 1.3 mmol/L (ref 0.5–1.9)

## 2023-02-10 LAB — GLUCOSE, CAPILLARY
Glucose-Capillary: 382 mg/dL — ABNORMAL HIGH (ref 70–99)
Glucose-Capillary: 337 mg/dL — ABNORMAL HIGH (ref 70–99)
Glucose-Capillary: 300 mg/dL — ABNORMAL HIGH (ref 70–99)
Glucose-Capillary: 268 mg/dL — ABNORMAL HIGH (ref 70–99)
Glucose-Capillary: 344 mg/dL — ABNORMAL HIGH (ref 70–99)

## 2023-02-10 LAB — PHOSPHORUS: Phosphorus: 4.2 mg/dL (ref 2.5–4.6)

## 2023-02-10 MED ORDER — SODIUM CHLORIDE 0.9 % IV BOLUS
1000.0000 mL | Freq: Once | INTRAVENOUS | Status: AC
  Administered 2023-02-10: 1000 mL via INTRAVENOUS

## 2023-02-10 MED ORDER — LIVING WELL WITH DIABETES BOOK
Freq: Once | Status: AC
  Filled 2023-02-10: qty 1

## 2023-02-10 MED ORDER — INSULIN GLARGINE-YFGN 100 UNIT/ML ~~LOC~~ SOLN
24.0000 [IU] | Freq: Every day | SUBCUTANEOUS | Status: DC
  Filled 2023-02-10: qty 0.24

## 2023-02-10 MED ORDER — METHOCARBAMOL 750 MG PO TABS
750.0000 mg | ORAL_TABLET | Freq: Four times a day (QID) | ORAL | Status: DC | PRN
  Administered 2023-02-10 – 2023-02-11 (×2): 750 mg via ORAL
  Filled 2023-02-10 (×2): qty 1

## 2023-02-10 MED ORDER — INSULIN ASPART PROT & ASPART (70-30 MIX) 100 UNIT/ML ~~LOC~~ SUSP
20.0000 [IU] | Freq: Two times a day (BID) | SUBCUTANEOUS | Status: DC
  Administered 2023-02-10 – 2023-02-11 (×2): 20 [IU] via SUBCUTANEOUS
  Filled 2023-02-10: qty 10

## 2023-02-10 NOTE — Progress Notes (Signed)
   02/10/23 1200  Spiritual Encounters  Type of Visit Initial  Care provided to: Patient  Referral source Patient request  Reason for visit Advance directives  OnCall Visit No   Ch responded to request for AD. There was no family present at bedside. Ch assisted pt with AD education. Pt will page Ch when form is completed. No follow-up needed at this time.

## 2023-02-10 NOTE — Inpatient Diabetes Management (Addendum)
Inpatient Diabetes Program Recommendations  AACE/ADA: New Consensus Statement on Inpatient Glycemic Control (2015)  Target Ranges:  Prepandial:   less than 140 mg/dL      Peak postprandial:   less than 180 mg/dL (1-2 hours)      Critically ill patients:  140 - 180 mg/dL   Lab Results  Component Value Date   GLUCAP 337 (H) 02/10/2023   HGBA1C >14.0 (A) 02/09/2023    Review of Glycemic Control  Latest Reference Range & Units 02/09/23 12:39 02/09/23 16:22 02/09/23 17:03 02/09/23 17:47 02/09/23 20:10 02/10/23 09:37 02/10/23 12:28  Glucose-Capillary 70 - 99 mg/dL >846 (HH) 962 (H) 952 (H) 418 (H) 255 (H) 382 (H) 337 (H)  (HH): Data is critically high (H): Data is abnormally high  Diabetes history: DM2 Outpatient Diabetes medications:  Metformin 1000 mg BID 70/30 20 units BID Jardiance 10 mg every day  Not taking any in a couple of months Current orders for Inpatient glycemic control:  Semglee 24 units every day Novolog 0-15 units TID and 0-5 units QHS  Inpatient Diabetes Program Recommendations:    Please consider:  70/30 20 units BID & DC Semglee  Discharge Recommendations: Intermediate acting recommendations: insulin aspart protamine - aspart (NOVOLOG 70/30) FlexPen 20 units BID  Supply/Referral recommendations: Pen needles - standard  Spoke with patient at bedside.  He states he has not taken any medication in a couple of months.  He states he stopped taking his 70/30 1 year ago because his PCP started him on 2 little pills to help him lose weight and he felt like he didn't need the insulin anymore.  He does not know the name of the pills.  He said he ran out of these pills 2 months ago.  In chart review of Internal Medicaine notes the only DM meds which are pills are Metformin and Jardiance.  Notes say he tried Victoza in the past and he states that he has never taken Victoza.    Reviewed patient's current A1c of 14% (average BG of 355 mg/dL). Explained what a A1c is and  what it measures. Also reviewed goal A1c with patient, importance of good glucose control @ home, and blood sugar goals.  He states when he did take the 70/30 he would only take it once a day a few times a week.  Explained that 70/30 has both fast and long acting insulin and it needs to be taken 2 x a day with breakfast and dinner.  He does not have a glucometer (will provide him with a [employer] glucometer today).  Asked him to check his BG ac/hs and bring meter to PCP follow up.  He is uninsured but he states Internal Medicine can assist him with his insulins.   Educated on The Plate Method, CHO's, portion control, CBGs at home fasting and mid afternoon, F/U with PCP every 3 months, bring meter to PCP office, long and short term complications of uncontrolled BG, and importance of exercise.  Discussed hypoglycemia, signs, symptoms and treatment.     Will continue to follow while inpatient.  Thank you, Dulce Sellar, MSN, CDCES Diabetes Coordinator Inpatient Diabetes Program [phone] (team pager from 8a-5p)

## 2023-02-10 NOTE — Progress Notes (Signed)
Transition of Care [facility]) - Inpatient Brief Assessment   Patient Details  Name: Kamoni Demott MRN: [id number] Date of Birth: 1966-07-09  Transition of Care [facility]) CM/SW Contact:    Janae Bridgeman, RN Phone Number: 02/10/2023, 4:29 PM   Clinical Narrative: Patient admitted to the hospital with diabetes.  Patient was provided with glucometer by diabetes coordinator at the bedside to send home with the patient.  Patient will be provided with resources regarding diabetes diet, general diabetes information, counseling for substance abuse relating to marijuana use.  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   Transition of Care Asessment: Insurance and Status: (P) Insurance coverage has been reviewed Patient has primary care physician: (P) Yes Home environment has been reviewed: (P) Yes Prior level of function:: (P) Independent Prior/Current Home Services: (P) No current home services Social Determinants of Health Reivew: (P) SDOH reviewed interventions complete Readmission risk has been reviewed: (P) Yes Transition of care needs: (P) transition of care needs identified, TOC will continue to follow

## 2023-02-10 NOTE — Progress Notes (Addendum)
HD#0 SUBJECTIVE:  Patient Summary: Joshua Petty is a 57 y.o. with a pertinent PMH of DM2 on insulin, HTN, HLD, who presented with 3 weeks of lethargy, polyuria, nausea, increased thirst, and orthostatic hypotension and admitted for HHS.   Overnight Events:  No acute events overnight   Interm History:  Patient was laying in bed upon entering room. He reports that he slept well and has been eating and drinking water. He says that his lethargy has improved. He did not feel lightheaded when he stood up. He endorses blurry vision in the center of his visual field. He still has the sensation that his feet are cold but no numbness. He says his back is still having spasms. No chest pain, shortness of breath, abdominal pain. Informed patient of the plan to increase his insulin and continue to rehydrate and he is agreeable to this plan.   OBJECTIVE:  Vital Signs: Vitals:   02/09/23 1704 02/09/23 2008 02/10/23 0458 02/10/23 0801  BP: 112/75 (!) 88/65 100/69 99/70  Pulse: 85 94 89 91  Resp: 18 18 18 18   Temp: 98 F (36.7 C) 98.9 F (37.2 C) (!) 97.4 F (36.3 C) 98.2 F (36.8 C)  TempSrc: Oral  Oral Oral  SpO2: 98% 95% 96% 97%   Supplemental O2: Room Air SpO2: 97 %   Intake/Output Summary (Last 24 hours) at 02/10/2023 1141 Last data filed at 02/10/2023 0902 Gross per 24 hour  Intake 1120 ml  Output 1420 ml  Net -300 ml   Net IO Since Admission: -300 mL [02/10/23 1141]  Physical Exam:  Gen: alert, well appearing, in no acute distress HEENT: normocephalic, atraumatic CV: RRR, no MRG Pulm: normal WOB Ab: normoactive bowel sounds, no tenderness Ext: no lower extremity edema, pedal pulses strong, skin warm, sensation intact  Patient Lines/Drains/Airways Status     Active Line/Drains/Airways     Name Placement date Placement time Site Days   Peripheral IV 02/09/23 20 G Right Antecubital 02/09/23  1243  Antecubital  1   Incision (Closed) 04/14/16 Scrotum Right 04/14/16  1510   -- 2493            Pertinent Labs:    Latest Ref Rng & Units 02/10/2023    3:35 AM 02/09/2023    1:07 PM 02/09/2023   12:42 PM  CBC  WBC 4.0 - 10.5 K/uL 7.3   8.5   Hemoglobin 13.0 - 17.0 g/dL 16.1  09.6  04.5   Hematocrit 39.0 - 52.0 % 35.8  43.0  38.9   Platelets 150 - 400 K/uL 253   303        Latest Ref Rng & Units 02/10/2023    3:35 AM 02/09/2023    7:19 PM 02/09/2023    1:07 PM  CMP  Glucose 70 - 99 mg/dL 409  811    BUN 6 - 20 mg/dL 28  29    Creatinine 9.14 - 1.24 mg/dL 7.82  9.56    Sodium 213 - 145 mmol/L 129  129  121   Potassium 3.5 - 5.1 mmol/L 3.6  3.5  4.6   Chloride 98 - 111 mmol/L 95  90    CO2 22 - 32 mmol/L 23  25    Calcium 8.9 - 10.3 mg/dL 8.9  9.2      Recent Labs    02/09/23 1747 02/09/23 2010 02/10/23 0937  GLUCAP 418* 255* 382*     Pertinent Imaging: DG Chest Portable 1 View  Result  Date: 02/09/2023 CLINICAL DATA:  Hyperglycemia EXAM: PORTABLE CHEST 1 VIEW COMPARISON:  None Available. FINDINGS: The heart size and mediastinal contours are within normal limits. Both lungs are clear. No consolidation, pneumothorax or effusion. No edema. Degenerative changes of the spine. Overlapping cardiac leads. IMPRESSION: No acute cardiopulmonary disease. Electronically Signed   By: Karen Kays M.D.   On: 02/09/2023 14:10    ASSESSMENT/PLAN:  Assessment: Principal Problem:   Hyperosmolar hyperglycemic state (HHS) (HCC)   Joshua Petty is a 57 y.o. with pertinent PMH of DM2 on insulin, HTN, HLD, who presented with 3 weeks of lethargy, polyuria, nausea, increased thirst, and orthostatic hypotension and admitted for HHS on hospital day 0  Plan: #HHS #Type II Diabetes Patient reports that his lethargy is improving. He has been eating and drinking fluids. Fasting blood glucose this morning was 382. Increased his basal insulin to 24 units. Will see his insulin requirements today and likely start a mealtime insulin in addition to SSI.Creatine improving  (down to 1.36 from admission of 1.82). -Insulin glargine 24 units -SSI aspart -Add mealtime insulin -Involve diabetic educator if available  #AKI on suspected CKD3a, resolving Patient was admitted with BUNs/creatinine ratio greater than 25 was consistent with a prerenal AKI.  This ratio is greater than 20 likely in the setting of dehydration. -IVF bolus -Trend CMP  #Orthostatic Hypotension #Dehydration Patient's systolic BP dropped ~45 points from laying to standing. Giving a IVF bolus. Will recheck orthostatics tomorrow. -IVF bolus -Repeat orthostatics tomorrow  #T Wave Depression, resolved T wave depressions noted on EKG from admission that was not seen from previous EKGs.  No reported chest pain. Not present on repeat EKG 7/26.  #Back Muscle Spasms Muscle spasms have not resolved since admission. Starting on robaxin to see if this improves symptoms. -Robaxin 750 mg q6 PRN  #Blurry Vision Patient has hx of open angle glaucoma, cataracts, and diabetic retinopathy. Last ophthalmology note is from 05/29/2020 but patient says he saw an eye doctor earlier this week. This is not an acute problem and can be managed outpatient.  #Pseudohyponatremia  Patient was admitted with sodium value of 128.  Corrected value was 135.  Today, his sodium is 129 with a corrected value of 132.   Best Practice: Diet: Diabetic diet IVF: Fluids: 0.9NS, Rate:  1000 cc bolus VTE: enoxaparin (LOVENOX) injection 40 mg Start: 02/09/23 2200 Code: Full AB: none Therapy Recs: Pending, DME: none Family Contact: Joshua Petty DISPO: Anticipated discharge tomorrow to Home pending  improved glycemic control .  Signature: Joshua Petty Patient, Medical Student   Please contact the on call pager after 5 pm and on weekends at [phone].  Attestation for Student Documentation:  I personally was present and performed or re-performed the history, physical exam and medical decision-making activities of this service  and have verified that the service and findings are accurately documented in the student's note.  Morrie Sheldon, MD 02/10/2023, 1:07 PM\

## 2023-02-10 NOTE — Plan of Care (Signed)
  Problem: Metabolic: Goal: Ability to maintain appropriate glucose levels will improve Outcome: Progressing   Problem: Nutritional: Goal: Maintenance of adequate nutrition will improve Outcome: Progressing   Problem: Clinical Measurements: Goal: Respiratory complications will improve Outcome: Progressing   Problem: Nutrition: Goal: Adequate nutrition will be maintained Outcome: Progressing   Problem: Coping: Goal: Level of anxiety will decrease Outcome: Progressing

## 2023-02-10 NOTE — Plan of Care (Signed)

## 2023-02-11 ENCOUNTER — Other Ambulatory Visit (HOSPITAL_COMMUNITY): Payer: Self-pay

## 2023-02-11 ENCOUNTER — Inpatient Hospital Stay (HOSPITAL_COMMUNITY): Payer: Medicaid Other

## 2023-02-11 DIAGNOSIS — R739 Hyperglycemia, unspecified: Secondary | ICD-10-CM

## 2023-02-11 LAB — GLUCOSE, CAPILLARY
Glucose-Capillary: 304 mg/dL — ABNORMAL HIGH (ref 70–99)
Glucose-Capillary: 229 mg/dL — ABNORMAL HIGH (ref 70–99)
Glucose-Capillary: 217 mg/dL — ABNORMAL HIGH (ref 70–99)

## 2023-02-11 MED ORDER — INSULIN ISOPHANE & REGULAR (HUMAN 70-30)100 UNIT/ML KWIKPEN
20.0000 [IU] | PEN_INJECTOR | Freq: Two times a day (BID) | SUBCUTANEOUS | 0 refills | Status: DC
  Filled 2023-02-11 (×2): qty 12, 30d supply, fill #0

## 2023-02-11 MED ORDER — ROSUVASTATIN CALCIUM 10 MG PO TABS
10.0000 mg | ORAL_TABLET | Freq: Every day | ORAL | 0 refills | Status: DC
  Filled 2023-02-11 (×2): qty 30, 30d supply, fill #0

## 2023-02-11 MED ORDER — METFORMIN HCL ER (OSM) 1000 MG PO TB24: 1000.0000 mg | ORAL_TABLET | Freq: Two times a day (BID) | ORAL | 0 refills | Status: DC

## 2023-02-11 MED ORDER — EMPAGLIFLOZIN 10 MG PO TABS
10.0000 mg | ORAL_TABLET | Freq: Every day | ORAL | 0 refills | Status: DC
  Filled 2023-02-11: qty 30, 30d supply, fill #0

## 2023-02-11 MED ORDER — INSULIN ISOPHANE & REGULAR (HUMAN 70-30)100 UNIT/ML KWIKPEN: 20.0000 [IU] | PEN_INJECTOR | Freq: Two times a day (BID) | SUBCUTANEOUS | 0 refills | Status: DC

## 2023-02-11 MED ORDER — INSULIN PEN NEEDLE 31G X 8 MM MISC
1.0000 | Freq: Two times a day (BID) | 0 refills | Status: DC
Start: 2023-02-11 — End: 2023-02-28
  Filled 2023-02-11: qty 100, 30d supply, fill #0
  Filled 2023-02-11: qty 100, 50d supply, fill #0

## 2023-02-11 MED ORDER — LIDOCAINE 5 % EX PTCH
1.0000 | MEDICATED_PATCH | CUTANEOUS | 0 refills | Status: AC
  Filled 2023-02-11: qty 30, 30d supply, fill #0

## 2023-02-11 MED ORDER — METHOCARBAMOL 750 MG PO TABS
750.0000 mg | ORAL_TABLET | Freq: Four times a day (QID) | ORAL | 0 refills | Status: DC | PRN
  Filled 2023-02-11 (×2): qty 30, 8d supply, fill #0

## 2023-02-11 MED ORDER — METFORMIN HCL 1000 MG PO TABS
1000.0000 mg | ORAL_TABLET | Freq: Two times a day (BID) | ORAL | 0 refills | Status: DC
Start: 2023-02-11 — End: 2023-02-11
  Filled 2023-02-11 (×2): qty 60, 30d supply, fill #0

## 2023-02-11 MED ORDER — PEN NEEDLES 31G X 8 MM MISC: 1.0000 | Freq: Two times a day (BID) | 1 refills | Status: DC

## 2023-02-11 MED ORDER — SENNOSIDES-DOCUSATE SODIUM 8.6-50 MG PO TABS
1.0000 | ORAL_TABLET | Freq: Two times a day (BID) | ORAL | Status: DC
  Administered 2023-02-11: 1 via ORAL
  Filled 2023-02-11: qty 1

## 2023-02-11 MED ORDER — LIDOCAINE 5 % EX PTCH
1.0000 | MEDICATED_PATCH | CUTANEOUS | Status: DC
  Administered 2023-02-11: 1 via TRANSDERMAL
  Filled 2023-02-11: qty 1

## 2023-02-11 NOTE — Discharge Summary (Signed)
Name: Tyrion Tornatore MRN: [id number] DOB: 06-29-1966 57 y.o. PCP: Olegario Messier, MD  Date of Admission: 02/09/2023 12:29 PM Date of Discharge:  02/11/2023 Attending Physician: Dr.  Lafonda Mosses  DISCHARGE DIAGNOSIS:  Primary Problem: Hyperglycemia   Hospital Problems: Principal Problem:   Hyperglycemia Active Problems:   Stage 3a chronic kidney disease (CKD) (HCC)   Orthostatic hypotension   Hyperosmolar hyperglycemic state (HHS) (HCC)    DISCHARGE MEDICATIONS:   Allergies as of 02/11/2023   No Known Allergies      Medication List     STOP taking these medications    amLODipine 5 MG tablet Commonly known as: NORVASC   insulin NPH-regular Human (70-30) 100 UNIT/ML injection Replaced by: insulin isophane & regular human KwikPen (70-30) 100 UNIT/ML KwikPen   Jardiance 10 MG Tabs tablet Generic drug: empagliflozin   lisinopril-hydrochlorothiazide 20-12.5 MG tablet Commonly known as: ZESTORETIC       TAKE these medications    True Metrix Blood Glucose Test test strip Generic drug: glucose blood If you are injecting insulin you MUST check your blood sugar. If <100 do not inject insulin   glucose blood test strip Use as instructed   insulin isophane & regular human KwikPen (70-30) 100 UNIT/ML KwikPen Commonly known as: HUMULIN 70/30 MIX Inject 20 Units into the skin 2 (two) times daily with a meal. Replaces: insulin NPH-regular Human (70-30) 100 UNIT/ML injection   Insulin Pen Needle 31G X 8 MM Misc Use 2 (two) times daily. What changed:  medication strength when to take this   lidocaine 5 % Commonly known as: LIDODERM Place 1 patch onto the skin daily. Remove & Discard patch within 12 hours or as directed by MD Start taking on: February 12, 2023   metFORMIN 1000 MG tablet Commonly known as: GLUCOPHAGE Take 1 tablet (1,000 mg total) by mouth 2 (two) times daily with a meal.   methocarbamol 750 MG tablet Commonly known as: ROBAXIN Take 1 tablet (750 mg  total) by mouth every 6 (six) hours as needed for muscle spasms.   rosuvastatin 10 MG tablet Commonly known as: Crestor Take 1 tablet (10 mg total) by mouth daily.   True Metrix Meter w/Device Kit If you are injecting insulin you MUST check your blood sugar. If <100 do not inject insulin   TRUEplus Lancets 30G Misc If you are injecting insulin you MUST check your blood sugar. If <100 do not inject insulin        DISPOSITION AND FOLLOW-UP:  Mr.Braxten Suarez was discharged from [facility] in Stable condition. At the hospital follow up visit please address:  HHS/Uncontrolled type 2 diabetes Discharged on 70/30 insulin 20 units twice daily (may need this increased) and metformin 1000 mg bid Discontinued jardiance - readdress if patient willing to restart this vs starting another medication Ensure patient is checking CBG at home    Follow-up Recommendations: Consults: None Labs:  None Studies: None New Medications: Novolin 70/30 20u BID  Follow-up Appointments:  Follow-up Information     Faith Rogue, DO .   Specialty: Internal Medicine Contact information: [address] [place] [address] [phone]                 HOSPITAL COURSE:  Patient Summary: #HHS #Type II Diabetes Patient presented with a blood sugar over 600 with an osmolality of 309. Slight ketones in blood and urine and a gap of 15. He had not been taking his insulin or metformin for the past month  because he thought he was hypoglycemic. He reports he was only taking his insulin about twice a week before then. Patient was given 2 boluses of NS when he presented to hospital and given 20 units long acting insulin glargine and sliding scale insulin aspart. Blood sugar was still elevated on hospital day 2, so he was switched to his home Novolin (70/30 insulin) 20 units BID. He was discharged on the same regimen, and advised to continue taking metformin and to discontinue jardiance (has  said he did not feel well taking it, could readdress this as an outpatient).  #Orthostatic Hypotension Patient reports lightheadedness upon standing for the past month, likely in the setting of dehydration. His orthostatics were positive in the clinic before he came to the hospital. BP meds were held. Orthostatic hypotension still present with ~ 45 point change in systolic from lying to standing on hospital day 2 so IVF bolus given. Orthostatic hypotension was resolved at time of discharge. Held antihypertensive medications due to low-normal BP.  #HLD Patient was formerly on Crestor but stopped because he said it made him constipated. His last lipid panel in 2021 showed LDL of 78. Follow up as an outpatient.   #Left knee pain Xray showed no acute fracture or dislocation. The patient states that he has chronic knee pain and uses a lidocaine patch for this at home. Discharged with lidocaine patch and PRN robaxin.    DISCHARGE INSTRUCTIONS:   Discharge Instructions     Call MD for:  difficulty breathing, headache or visual disturbances   Complete by: As directed    Call MD for:  persistant dizziness or light-headedness   Complete by: As directed    Call MD for:  persistant nausea and vomiting   Complete by: As directed    Call MD for:  severe uncontrolled pain   Complete by: As directed    Call MD for:  temperature >100.4   Complete by: As directed    Diet clear liquid   Complete by: As directed    Discharge instructions   Complete by: As directed    Dear Mr. Chalupa,  You were hospitalized because your blood sugar was very high, and you had something called HHS (hyperosmolar hyperglycemic state). We treated you with insulin, and now, you need to restart using your insulin at home.  Please use your Novolin (70/30 insulin) - take 20 units TWICE a day with meals. Please also restart taking jardiance 10 mg everyday and metformin 1000 mg twice a day. It will be very important to get your  diabetes under better control, so we can prevent another hospitalization.   For your knee pain, we did an xray and it was normal. You can continue to use the lidocaine patches, since they are helpful. You can also take robaxin 750 mg every 6 hours, as needed, for muscle spasms/pain.   You have an appt on August 13th at 8:45 AM with Dr. Hessie Diener at the internal medicine center (on the ground floor of the hospital). If you have any questions or concerns before then, call our clinic at [phone] or after hours call [phone] and ask for the internal medicine resident on call.  We are glad you are feeling better!   Increase activity slowly   Complete by: As directed        SUBJECTIVE:  Ayo Dejardin was seen on the day of discharge. He denies any n/v/d. He does endorse some L knee pain, but usually uses a lidocaine patch  for this at home.   Discharge Vitals:   BP 108/86 (BP Location: Left Arm)   Pulse 99   Temp 98.5 F (36.9 C)   Resp 17   SpO2 99%   OBJECTIVE:  General: Pleasant, well-appearing middle aged male laying in bed. No acute distress. CV: RRR. No murmurs. No LE edema Pulmonary: Lungs CTAB. Normal effort. No wheezing or rales. Abdominal: Soft, nontender, nondistended. Normal bowel sounds. Extremities: L knee does not have any palpable effusions, not warm or tender on palpation. Skin: Warm and dry.  Neuro: A&Ox3. No focal deficit. Psych: Normal mood and affect    Pertinent Labs, Studies, and Procedures:     Latest Ref Rng & Units 02/10/2023    3:35 AM 02/09/2023    1:07 PM 02/09/2023   12:42 PM  CBC  WBC 4.0 - 10.5 K/uL 7.3   8.5   Hemoglobin 13.0 - 17.0 g/dL 40.9  81.1  91.4   Hematocrit 39.0 - 52.0 % 35.8  43.0  38.9   Platelets 150 - 400 K/uL 253   303        Latest Ref Rng & Units 02/11/2023    3:53 AM 02/10/2023    3:35 AM 02/09/2023    7:19 PM  CMP  Glucose 70 - 99 mg/dL 782  956  213   BUN 6 - 20 mg/dL 23  28  29    Creatinine 0.61 - 1.24 mg/dL 0.86   5.78  4.69   Sodium 135 - 145 mmol/L 129  129  129   Potassium 3.5 - 5.1 mmol/L 3.8  3.6  3.5   Chloride 98 - 111 mmol/L 95  95  90   CO2 22 - 32 mmol/L 21  23  25    Calcium 8.9 - 10.3 mg/dL 9.0  8.9  9.2     DG Chest Portable 1 View  Result Date: 02/09/2023 CLINICAL DATA:  Hyperglycemia EXAM: PORTABLE CHEST 1 VIEW COMPARISON:  None Available. FINDINGS: The heart size and mediastinal contours are within normal limits. Both lungs are clear. No consolidation, pneumothorax or effusion. No edema. Degenerative changes of the spine. Overlapping cardiac leads. IMPRESSION: No acute cardiopulmonary disease. Electronically Signed   By: Karen Kays M.D.   On: 02/09/2023 14:10     Signed: Elza Rafter, D.O.  Internal Medicine Resident, PGY-3 [facility]  Pager: [phone] 12:35 PM, 02/11/2023

## 2023-02-11 NOTE — Plan of Care (Signed)

## 2023-02-12 ENCOUNTER — Other Ambulatory Visit: Payer: Self-pay | Admitting: Student

## 2023-02-12 DIAGNOSIS — E118 Type 2 diabetes mellitus with unspecified complications: Secondary | ICD-10-CM

## 2023-02-12 NOTE — Progress Notes (Signed)
Internal Medicine Clinic Attending  I was physically present during the key portions of the resident provided service and participated in the medical decision making of patient's management care. I reviewed pertinent patient test results.  The assessment, diagnosis, and plan were formulated together and I agree with the documentation in the resident's note.  Mercie Eon, MD     See my separate note for full details  Patient was admitted to the hospital and will follow up in 2 weeks

## 2023-02-13 ENCOUNTER — Other Ambulatory Visit (HOSPITAL_COMMUNITY): Payer: Self-pay

## 2023-02-13 ENCOUNTER — Telehealth: Payer: Self-pay

## 2023-02-13 NOTE — Transitions of Care (Post Inpatient/ED Visit) (Signed)
02/13/2023  Name: Joshua Petty MRN: [id number] DOB: Mar 06, 1966  Today's TOC FU Call Status: Today's TOC FU Call Status:: Successful TOC FU Call Competed TOC FU Call Complete Date: 02/13/23  Transition Care Management Follow-up Telephone Call Date of Discharge: 02/11/23 Discharge Facility: [facility]) Type of Discharge: Inpatient Admission Primary Inpatient Discharge Diagnosis:: hyperglycemia How have you been since you were released from the hospital?: Better Any questions or concerns?: No  Items Reviewed: Did you receive and understand the discharge instructions provided?: Yes Medications obtained,verified, and reconciled?: Yes (Medications Reviewed) Any new allergies since your discharge?: No Dietary orders reviewed?: Yes Do you have support at home?: Yes People in Home: parent(s)  Medications Reviewed Today: Medications Reviewed Today     Reviewed by Karena Addison, LPN (Licensed Practical Nurse) on 02/13/23 at 1037  Med List Status: <None>   Medication Order Taking? Sig Documenting Provider Last Dose Status Informant  Blood Glucose Monitoring Suppl (TRUE METRIX METER) w/Device KIT [id number]  If you are injecting insulin you MUST check your blood sugar. If <100 do not inject insulin Masters, Katie, DO  Active Self, Pharmacy Records  glucose blood (TRUE METRIX BLOOD GLUCOSE TEST) test strip [id number]  If you are injecting insulin you MUST check your blood sugar. If <100 do not inject insulin Masters, Katie, DO  Active Self, Pharmacy Records  glucose blood test strip [id number]  Use as instructed Masters, Florentina Addison, DO  Active Self, Pharmacy Records  insulin isophane & regular human KwikPen (HUMULIN 70/30 MIX) (70-30) 100 UNIT/ML KwikPen [id number]  Inject 20 Units into the skin 2 (two) times daily with a meal. Atway, Rayann N, DO  Active   Insulin Pen Needle (PEN NEEDLES) 31G X 8 MM MISC [id number]  1 each by Does not apply route in the morning and at bedtime. Chauncey Mann,  DO  Active   Insulin Pen Needle 31G X 8 MM MISC [id number]  Use 2 (two) times daily. Chauncey Mann, DO  Active   lidocaine (LIDODERM) 5 % [id number]  Place 1 patch onto the skin daily. Remove & Discard patch within 12 hours or as directed by MD Atway, Rayann N, DO  Active   metformin (FORTAMET) 1000 MG (OSM) 24 hr tablet [id number]  Take 1 tablet (1,000 mg total) by mouth 2 (two) times daily with a meal. Atway, Rayann N, DO  Active   methocarbamol (ROBAXIN) 750 MG tablet [id number]  Take 1 tablet (750 mg total) by mouth every 6 (six) hours as needed for muscle spasms. Atway, Rayann N, DO  Active   rosuvastatin (CRESTOR) 10 MG tablet [id number]  Take 1 tablet (10 mg total) by mouth daily. Chauncey Mann, DO  Active   TRUEplus Lancets 30G MISC [id number]  If you are injecting insulin you MUST check your blood sugar. If <100 do not inject insulin Masters, [place], DO  Active Self, Pharmacy Records            Home Care and Equipment/Supplies: Were Home Health Services Ordered?: NA Any new equipment or medical supplies ordered?: NA  Functional Questionnaire: Do you need assistance with bathing/showering or dressing?: No Do you need assistance with meal preparation?: No Do you need assistance with eating?: No Do you have difficulty maintaining continence: No Do you need assistance with getting out of bed/getting out of a chair/moving?: No Do you have difficulty managing or taking your medications?: No  Follow up appointments reviewed: PCP Follow-up appointment confirmed?: Yes Date of PCP follow-up appointment?: 02/28/23 Follow-up  Provider: [facility] Follow-up appointment confirmed?: NA Do you need transportation to your follow-up appointment?: No Do you understand care options if your condition(s) worsen?: Yes-patient verbalized understanding    SIGNATURE Karena Addison, LPN [facility] Nurse Health Advisor Direct Dial [phone]

## 2023-02-27 NOTE — Progress Notes (Incomplete)
   Established Patient Office Visit  Subjective   Patient ID: Joshua Petty, male    DOB: 1965-12-17  Age: 57 y.o. MRN: [id number]  No chief complaint on file.   Joshua Petty is a 57 year old male   {History (Optional):23778}  ROS    Objective:     There were no vitals taken for this visit. {Vitals History (Optional):23777}  Physical Exam   No results found for any visits on 02/28/23.  {Labs (Optional):23779}  The ASCVD Risk score (Arnett DK, et al., 2019) failed to calculate for the following reasons:   Cannot find a previous HDL lab   Cannot find a previous total cholesterol lab    Assessment & Plan:   Problem List Items Addressed This Visit   None T2DM HHS: HHS/Uncontrolled type 2 diabetes Discharged on 70/30 insulin 20 units twice daily (may need this increased) and metformin 1000 mg bid Discontinued jardiance - readdress if patient willing to restart this vs starting another medication Ensure patient is checking CBG at home  HLD  No follow-ups on file.    Faith Rogue, DO

## 2023-02-28 ENCOUNTER — Other Ambulatory Visit (HOSPITAL_COMMUNITY): Payer: Self-pay

## 2023-02-28 ENCOUNTER — Other Ambulatory Visit: Payer: Self-pay

## 2023-02-28 ENCOUNTER — Ambulatory Visit (INDEPENDENT_AMBULATORY_CARE_PROVIDER_SITE_OTHER): Payer: Medicaid Other | Admitting: Student

## 2023-02-28 ENCOUNTER — Encounter: Payer: Self-pay | Admitting: Student

## 2023-02-28 VITALS — BP 121/85 | HR 79 | Ht 67.0 in | Wt 222.9 lb

## 2023-02-28 DIAGNOSIS — Z7984 Long term (current) use of oral hypoglycemic drugs: Secondary | ICD-10-CM | POA: Diagnosis not present

## 2023-02-28 DIAGNOSIS — E1100 Type 2 diabetes mellitus with hyperosmolarity without nonketotic hyperglycemic-hyperosmolar coma (NKHHC): Secondary | ICD-10-CM

## 2023-02-28 DIAGNOSIS — E782 Mixed hyperlipidemia: Secondary | ICD-10-CM | POA: Diagnosis not present

## 2023-02-28 DIAGNOSIS — I10 Essential (primary) hypertension: Secondary | ICD-10-CM

## 2023-02-28 MED ORDER — LISINOPRIL-HYDROCHLOROTHIAZIDE 20-12.5 MG PO TABS
2.0000 | ORAL_TABLET | Freq: Every day | ORAL | 2 refills | Status: DC
  Filled 2023-02-28 – 2023-03-22 (×2): qty 60, 30d supply, fill #0

## 2023-02-28 MED ORDER — INSULIN GLARGINE 100 UNIT/ML SOLOSTAR PEN
30.0000 [IU] | PEN_INJECTOR | Freq: Every day | SUBCUTANEOUS | 3 refills | Status: DC
  Filled 2023-02-28 – 2023-03-22 (×2): qty 9, 30d supply, fill #0

## 2023-02-28 MED ORDER — ROSUVASTATIN CALCIUM 10 MG PO TABS: 10.0000 mg | ORAL_TABLET | Freq: Every day | ORAL | 3 refills | Status: DC

## 2023-02-28 MED ORDER — INSULIN PEN NEEDLE 32G X 4 MM MISC
100.0000 | 3 refills | Status: DC
Start: 2023-02-28 — End: 2024-05-06
  Filled 2023-02-28: qty 100, 90d supply, fill #0

## 2023-02-28 NOTE — Assessment & Plan Note (Signed)
Patient was hospitalized with HHS due to medication noncompliance of type 2 diabetes mellitus.  He was not taking insulin or metformin for a month before his hospitalization because he thought that he was hypoglycemic.  A1c measured in July was greater than 14 and he was discharged home on 70/30 insulin 20 units twice daily and metformin 1000 mg twice daily. He reported taking insulin 30 units once a day instead of the 20 units BID. He also reported running out of insulin a few days prior but he is still taking the metformin 2000mg  daily. He measures his blood glucose once a day AFTER he eat which ranges around 160/170. He denied hyperglycemic sx.  PLAN: -Discontinued 70/30 insulin -Began 30 units Lantus once a day -Continue Metformin 2000mg  -Discontinue Jardiance.  -Repeat A1c in 2 months -Patient is willing to become established with [name], referral sent -Patiet is willing to check FASTING glucose levels for one week and call us with the results.

## 2023-02-28 NOTE — Assessment & Plan Note (Signed)
Patient has a history of essential hypertension.  During hospitalization he was orthostatically hypotensive and his home regimen of lisinopril hydrochlorothiazide was held during hospitalization.  Patient was unsure whether or not he was post to continue avoiding this medication or to take the medication after hospitalization, so he began to take lisinopril-chlorothiazide 20-12.5 mg two pills daily.  He reported checking his blood pressure here and there at home.  He denied symptoms of hypotension.  His blood pressure today is 121/85, patient reported taking lisinopril hydrochlorothiazide side this morning. PLAN: -Patient was instructed to continue lisinopril hydrochlorothiazide -Patient was provided instructions to hold blood pressure medication if his blood pressure is less than 100/60 -BMP ordered for today

## 2023-02-28 NOTE — Assessment & Plan Note (Signed)
Patient has a history of hyperlipidemia, prior lipid profile in 2021 shows LDL of 78.  Patient was supposedly on Crestor at one point but discontinued because it was making feel constipated. -Lipid profile not ordered today due to lack of insurance/patient preference -Crestor 10mg  ordered.

## 2023-02-28 NOTE — Patient Instructions (Signed)
Please check your blood sugar in the morning before you eat for 1 week.  You can either call with the numbers or send [place] a message on MyChart with the numbers.  We are sending you in a prescription for Lantus which is a once a day insulin injection.  You will use 30 units of insulin per day.  Please continue the metformin.  Please do not take the Jardiance.  We will send a referral to [name], who will talk to you about nutrition, exercise, and medications with diabetes.   Please check your blood pressure if you feel lightheaded or dizzy.  If the numbers are less than 100/60, please do not take any blood pressure medications.    We will have you come back within 1 to 2 months to check on both the blood pressure and the diabetes.

## 2023-03-01 NOTE — Progress Notes (Signed)
Spoke with the patient on the phone.  He was notified that his BMP is stable and that his sodium and potassium is within normal limits.  No further lab testing needed at this moment

## 2023-03-01 NOTE — Progress Notes (Signed)
Internal Medicine Clinic Attending  I was physically present during the key portions of the resident provided service and participated in the medical decision making of patient's management care. I reviewed pertinent patient test results.  The assessment, diagnosis, and plan were formulated together and I agree with the documentation in the resident's note.  Mercie Eon, MD    I know Mr Ceci from the hospital- he looks much better I agree with switching to Lantus, which we have at our outpatient pharmacy

## 2023-03-09 ENCOUNTER — Other Ambulatory Visit (HOSPITAL_COMMUNITY): Payer: Self-pay

## 2023-03-12 ENCOUNTER — Other Ambulatory Visit: Payer: Self-pay | Admitting: Student

## 2023-03-12 DIAGNOSIS — E118 Type 2 diabetes mellitus with unspecified complications: Secondary | ICD-10-CM

## 2023-03-14 NOTE — Telephone Encounter (Signed)
Zestoretic was recently filled.

## 2023-03-21 LAB — HM DIABETES EYE EXAM

## 2023-03-22 ENCOUNTER — Other Ambulatory Visit (HOSPITAL_COMMUNITY): Payer: Self-pay

## 2023-04-06 ENCOUNTER — Other Ambulatory Visit: Payer: Self-pay

## 2023-04-06 ENCOUNTER — Encounter: Payer: Self-pay | Admitting: Student

## 2023-04-06 ENCOUNTER — Ambulatory Visit (INDEPENDENT_AMBULATORY_CARE_PROVIDER_SITE_OTHER): Payer: Medicaid Other | Admitting: Student

## 2023-04-06 VITALS — BP 120/82 | HR 78 | Temp 97.5°F | Ht 67.0 in | Wt 225.8 lb

## 2023-04-06 DIAGNOSIS — E1100 Type 2 diabetes mellitus with hyperosmolarity without nonketotic hyperglycemic-hyperosmolar coma (NKHHC): Secondary | ICD-10-CM

## 2023-04-06 DIAGNOSIS — Z794 Long term (current) use of insulin: Secondary | ICD-10-CM | POA: Diagnosis not present

## 2023-04-06 DIAGNOSIS — Z1211 Encounter for screening for malignant neoplasm of colon: Secondary | ICD-10-CM

## 2023-04-06 DIAGNOSIS — Z0000 Encounter for general adult medical examination without abnormal findings: Secondary | ICD-10-CM

## 2023-04-06 MED ORDER — FREESTYLE LIBRE 2 READER DEVI: 7 refills | Status: DC

## 2023-04-06 MED ORDER — FREESTYLE LIBRE 2 SENSOR MISC: 7 refills | Status: DC

## 2023-04-06 NOTE — Assessment & Plan Note (Signed)
FIT testing ordered today.

## 2023-04-06 NOTE — Assessment & Plan Note (Signed)
Patient presents for follow-up regarding his type 2 diabetes.  His current regimen is Lantus 30 units, and metformin at 1000 mg twice a day.  He states that he checks his sugars at home via fingerstick and they are usually around 75.  Of note, he only checks it about 3-4 times a week.  He denies any symptoms such as polydipsia or polyuria.  Last A1c greater than 14 when he was hospitalized with HHS a month ago.  I do believe he would benefit from another antihyperglycemic agent at this time, he was previously on Jardiance however he is hesitant to restart this as he believes it was the cause of his HHS.  I discussed GLP-1 therapy with him, however he is content with his current regimen, and is symptomatically improving.  Will continue current regimen, but prefaced to patient that may need to add mealtime insulin if at next A1c check he is not improving well.  Plan: - Continue Lantus 30 units - Continue metformin 1000 mg twice a day - His phone is currently incompatible with his current CGM, will place order for freestyle libre sensor and receiver as this may be covered by his insurance

## 2023-04-06 NOTE — Patient Instructions (Addendum)
Thank you so much for coming to the clinic today!   At this time, we are not making any changes to your regimen. I have sent a continuous glucose monitor and receiver to the pharmacy, if it is too expensive don't worry about it. Please try checking your sugars twice a day, before you eat and then at night.  Write down these numbers and please bring to your next appointment.  If you have any questions please feel free to the call the clinic at anytime at [phone]. It was a pleasure seeing you!  Best, Dr. Thomasene Ripple

## 2023-04-06 NOTE — Progress Notes (Signed)
CC: Diabetes follow up   HPI:  [name] is a 58 y.o. male living with a history stated below and presents today for diabetes follow-up. Please see problem based assessment and plan for additional details.  Past Medical History:  Diagnosis Date   Diabetes (HCC)    Gonorrhea    Hyperlipidemia    Hypertension     Current Outpatient Medications on File Prior to Visit  Medication Sig Dispense Refill   Blood Glucose Monitoring Suppl (TRUE METRIX METER) w/Device KIT If you are injecting insulin you MUST check your blood sugar. If <100 do not inject insulin 1 kit 0   glucose blood (TRUE METRIX BLOOD GLUCOSE TEST) test strip If you are injecting insulin you MUST check your blood sugar. If <100 do not inject insulin 100 each 2   glucose blood test strip Use as instructed 100 each 12   insulin glargine (LANTUS) 100 UNIT/ML Solostar Pen Inject 30 Units into the skin daily. 9 mL 3   Insulin Pen Needle 32G X 4 MM MISC Use as directed. 100 each 3   lidocaine (LIDODERM) 5 % Place 1 patch onto the skin daily. Remove & Discard patch within 12 hours or as directed by MD 30 patch 0   lisinopril-hydrochlorothiazide (ZESTORETIC) 20-12.5 MG tablet Take 2 tablets by mouth daily. 60 tablet 2   metformin (FORTAMET) 1000 MG (OSM) 24 hr tablet Take 1 tablet (1,000 mg total) by mouth 2 (two) times daily with a meal. 60 tablet 0   metFORMIN (GLUCOPHAGE) 1000 MG tablet TAKE 1 TABLET BY MOUTH TWICE DAILY WITH A MEAL 180 tablet 0   methocarbamol (ROBAXIN) 750 MG tablet Take 1 tablet (750 mg total) by mouth every 6 (six) hours as needed for muscle spasms. 30 tablet 0   rosuvastatin (CRESTOR) 10 MG tablet Take 1 tablet (10 mg total) by mouth daily. 30 tablet 3   TRUEplus Lancets 30G MISC If you are injecting insulin you MUST check your blood sugar. If <100 do not inject insulin 100 each 2   No current facility-administered medications on file prior to visit.    Family History  Problem Relation Age of  Onset   Hypertension Mother    Diabetes Father    Diabetes Sister     Social History   Socioeconomic History   Marital status: Single    Spouse name: Not on file   Number of children: Not on file   Years of education: Not on file   Highest education level: 12th grade  Occupational History   Occupation: Software engineer: Production assistant, radio FOR SELF EMPLOYED  Tobacco Use   Smoking status: Never   Smokeless tobacco: Never  Vaping Use   Vaping status: Never Used  Substance and Sexual Activity   Alcohol use: No   Drug use: Yes    Types: Marijuana    Comment: 4 joints/day   Sexual activity: Yes    Partners: Female    Birth control/protection: None  Other Topics Concern   Not on file  Social History Narrative   Significant other at bedside   Social Determinants of Health   Financial Resource Strain: Low Risk  (07/04/2017)   Overall Financial Resource Strain (CARDIA)    Difficulty of Paying Living Expenses: Not very hard  Food Insecurity: No Food Insecurity (02/09/2023)   Hunger Vital Sign    Worried About Running Out of Food in the Last Year: Never true    Ran Out of Food  in the Last Year: Never true  Transportation Needs: No Transportation Needs (02/09/2023)   PRAPARE - Administrator, Civil Service (Medical): No    Lack of Transportation (Non-Medical): No  Physical Activity: Insufficiently Active (07/04/2017)   Exercise Vital Sign    Days of Exercise per Week: 2 days    Minutes of Exercise per Session: 30 min  Stress: No Stress Concern Present (07/04/2017)   [employer] of Occupational Health - Occupational Stress Questionnaire    Feeling of Stress : Not at all  Social Connections: Socially Isolated (07/04/2017)   Social Connection and Isolation Panel [NHANES]    Frequency of Communication with Friends and Family: Once a week    Frequency of Social Gatherings with Friends and Family: Once a week    Attends Religious Services: Never    Doctor, general practice or Organizations: No    Attends Banker Meetings: Never    Marital Status: Divorced  Catering manager Violence: Not At Risk (02/09/2023)   Humiliation, Afraid, Rape, and Kick questionnaire    Fear of Current or Ex-Partner: No    Emotionally Abused: No    Physically Abused: No    Sexually Abused: No    Review of Systems: ROS negative except for what is noted on the assessment and plan.  Vitals:   04/06/23 0857  BP: 120/82  Pulse: 78  Temp: (!) 97.5 F (36.4 C)  TempSrc: Oral  SpO2: 100%  Weight: 225 lb 12.8 oz (102.4 kg)  Height: 5\' 7"  (1.702 m)    Physical Exam: Constitutional: well-appearing male  in no acute distress Cardiovascular: regular rate and rhythm, no m/r/g Pulmonary/Chest: normal work of breathing on room air, lungs clear to auscultation bilaterally Abdominal: soft, non-tender, non-distended Neurological: alert & oriented x 3, 5/5 strength in bilateral upper and lower extremities, normal sensation in lower extremities bilaterally   Assessment & Plan:   Uncontrolled type 2 diabetes mellitus with hyperosmolar nonketotic hyperglycemia (HCC) Patient presents for follow-up regarding his type 2 diabetes.  His current regimen is Lantus 30 units, and metformin at 1000 mg twice a day.  He states that he checks his sugars at home via fingerstick and they are usually around 75.  Of note, he only checks it about 3-4 times a week.  He denies any symptoms such as polydipsia or polyuria.  Last A1c greater than 14 when he was hospitalized with HHS a month ago.  I do believe he would benefit from another antihyperglycemic agent at this time, he was previously on Jardiance however he is hesitant to restart this as he believes it was the cause of his HHS.  I discussed GLP-1 therapy with him, however he is content with his current regimen, and is symptomatically improving.  Will continue current regimen, but prefaced to patient that may need to add mealtime insulin if  at next A1c check he is not improving well.  Plan: - Continue Lantus 30 units - Continue metformin 1000 mg twice a day - His phone is currently incompatible with his current CGM, will place order for freestyle libre sensor and receiver as this may be covered by his insurance  Healthcare maintenance FIT testing ordered today  Patient discussed with Dr. Maryagnes Amos Joshua Petty, M.D. [facility] Internal Medicine, PGY-2 Pager: [phone] Date 04/06/2023 Time 2:47 PM

## 2023-04-07 ENCOUNTER — Telehealth: Payer: Self-pay

## 2023-04-07 LAB — MICROALBUMIN / CREATININE URINE RATIO
Creatinine, Urine: 184.1 mg/dL
Microalb/Creat Ratio: 62 mg/g creat — ABNORMAL HIGH (ref 0–29)
Microalbumin, Urine: 113.7 ug/mL

## 2023-04-07 NOTE — Progress Notes (Signed)
 Internal Medicine Clinic Attending  Case discussed with the resident physician at the time of the visit.  We reviewed the patient's history, exam, and pertinent patient test results.  I agree with the assessment, diagnosis, and plan of care documented in the resident's note.

## 2023-04-07 NOTE — Telephone Encounter (Signed)
Decision:Approved Meredeth Ide (Key: [facility]) PA Case ID #: [id number] Rx #: [id number] Need Help? Call us at [phone] Outcome Approved today by [facility] Medicaid 2017 Approved. This drug has been approved. Approved quantity: 1 Receiver per 30 day(s). You may fill up to a 34 day supply at a retail pharmacy. You may fill up to a 90 day supply for maintenance drugs, please refer to the formulary for details. Please call the pharmacy to process your prescription claim. Authorization Expiration Date: 10/04/2023 Drug FreeStyle Libre 2 Reader device ePA cloud logo Form [facility] Medicaid of [facility] Electronic Prior Authorization Request Form [phone] NCPDP) Original Claim Info 26

## 2023-04-07 NOTE — Telephone Encounter (Signed)
Prior Authorization for patient (Freestyle Libre 2 Sensor) came through on cover my meds was submitted with last office notes and labs awaiting approval or denial.  [id number]

## 2023-04-07 NOTE — Telephone Encounter (Signed)
Decision:Approved Maciah Rampone (Key: [id number]) PA Case ID #: [id number] Rx #: [id number] Need Help? Call us at [phone] Outcome Approved today by [facility] Medicaid 2017 Approved. This drug has been approved. Approved quantity: 1 units per 14 day(s). The drug has been approved from 03/24/2023 to 10/04/2023. Please call the pharmacy to process your prescription claim. Generic or biosimilar substitution may be required when available and preferred on the formulary. Authorization Expiration Date: 10/04/2023 Drug FreeStyle Libre 2 Sensor ePA cloud logo Form [facility] Medicaid of [facility] Electronic Prior Authorization Request Form [phone] NCPDP) Original Claim Info 67

## 2023-04-07 NOTE — Telephone Encounter (Signed)
Prior Authorization for patient [facility] [place] 2 Reader) came through on cover my meds was submitted with last office notes awaiting approval or denial.  MVH:[id number]

## 2023-04-17 ENCOUNTER — Encounter: Payer: Self-pay | Admitting: Dietician

## 2023-04-18 ENCOUNTER — Ambulatory Visit (INDEPENDENT_AMBULATORY_CARE_PROVIDER_SITE_OTHER): Payer: Medicaid Other | Admitting: Dietician

## 2023-04-18 ENCOUNTER — Encounter: Payer: Self-pay | Admitting: Dietician

## 2023-04-18 VITALS — Wt 225.4 lb

## 2023-04-18 DIAGNOSIS — E1100 Type 2 diabetes mellitus with hyperosmolarity without nonketotic hyperglycemic-hyperosmolar coma (NKHHC): Secondary | ICD-10-CM

## 2023-04-18 LAB — GLUCOSE, CAPILLARY: Glucose-Capillary: 118 mg/dL — ABNORMAL HIGH (ref 70–99)

## 2023-04-18 NOTE — Progress Notes (Signed)
Diabetes Self-Management Education  Visit Type: First/Initial  Appt. Start Time: 1015 Appt. End Time: 1100  04/18/2023  [name], identified by name and date of birth, is a 57 y.o. male with a diagnosis of Diabetes: Type 2.   ASSESSMENT  Weight 225 lb 6.4 oz (102.2 kg). Body mass index is 35.3 kg/m. BP Readings from Last 3 Encounters:  04/06/23 120/82  02/28/23 121/85  02/11/23 108/86   Lab Results  Component Value Date   HGBA1C >14.0 (A) 02/09/2023   HGBA1C 7.8 (A) 01/26/2022   HGBA1C 6.5 (A) 05/06/2021   HGBA1C 6.9 (A) 05/28/2020   HGBA1C 6.6 (A) 03/02/2020   eGFR Cre: 63 at 02/28/2023  9:40 AM Calculated from: Serum Creatinine: 1.32 mg/dL at 7/82/9562  1:30 AM Age: 57 years Sex: Male at 02/28/2023  9:40 AM Calculated using the CKD-EPI Creatinine Equation (2021)   Lipid Panel     Component Value Date/Time   CHOL 203 (H) 08/26/2019 1417   TRIG 557 (HH) 08/26/2019 1417   HDL 37 (L) 08/26/2019 1417   CHOLHDL 5.5 (H) 08/26/2019 1417   CHOLHDL 5.9 04/14/2016 1321   VLDL 36 04/14/2016 1321   LDLCALC 78 08/26/2019 1417   LABVLDL 88 (H) 08/26/2019 1417      Diabetes Self-Management Education - 04/18/23 1000       Visit Information   Visit Type First/Initial      Initial Visit   Diabetes Type Type 2    Date Diagnosed 2014    Are you currently following a meal plan? Yes   no eggs, not much cheese, chicken and [place], seafood, working on increasing more vegetables   Are you taking your medications as prescribed? Yes      Health Coping   How would you rate your overall health? Good   getting better, vision     Psychosocial Assessment   Patient Belief/Attitude about Diabetes Motivated to manage diabetes    What is the hardest part about your diabetes right now, causing you the most concern, or is the most worrisome to you about your diabetes?   --   deferred to next visit   Self-care barriers Lack of material resources    Self-management support  Doctor's office;Family;CDE visits    Patient Concerns Monitoring;Nutrition/Meal planning;Support    Special Needs None    Preferred Learning Style No preference indicated    Learning Readiness Contemplating    How often do you need to have someone help you when you read instructions, pamphlets, or other written materials from your doctor or pharmacy? 3 - Sometimes    What is the last grade level you completed in school? deferred to next visit      Pre-Education Assessment   Patient understands the diabetes disease and treatment process. Comprehends key points    Patient understands incorporating nutritional management into lifestyle. Needs Review    Patient undertands incorporating physical activity into lifestyle. Needs Review    Patient understands using medications safely. Comprehends key points    Patient understands monitoring blood glucose, interpreting and using results Needs Review    Patient understands prevention, detection, and treatment of acute complications. Needs Review    Patient understands prevention, detection, and treatment of chronic complications. Needs Review    Patient understands how to develop strategies to address psychosocial issues. Comprehends key points    Patient understands how to develop strategies to promote health/change behavior. Comprehends key points      Complications   How often do  you check your blood sugar? 3-4 times / week    Fasting Blood glucose range (mg/dL) 829-562;130-865    Postprandial Blood glucose range (mg/dL) >784    Number of hypoglycemic episodes per month 0    Number of hyperglycemic episodes ( >200mg /dL): Rare    Can you tell when your blood sugar is high? Yes    What do you do if your blood sugar is high? chec    Have you had a dilated eye exam in the past 12 months? Yes    Have you had a dental exam in the past 12 months? No    Are you checking your feet? Yes    How many days per week are you checking your feet? 7       Dietary Intake   Breakfast v-8 splash juice x 1 cup    Beverage(s) water      Activity / Exercise   Activity / Exercise Type Light (walking / raking leaves);ADL's      Patient Education   Previous Diabetes Education Yes (please comment)   while in the hospital   Healthy Eating Meal timing in regards to the patients' current diabetes medication.    Monitoring Taught/evaluated CGM (comment);Daily foot exams    Acute complications Discussed and identified patients' prevention, symptoms, and treatment of hyperglycemia.    Chronic complications Dental care;Relationship between chronic complications and blood glucose control;Retinopathy and reason for yearly dilated eye exams      Individualized Goals (developed by patient)   Monitoring  Other (comment)   bring cgm equipment back to next visit     Post-Education Assessment   Patient understands incorporating nutritional management into lifestyle. Comprehends key points    Patient undertands incorporating physical activity into lifestyle. Comprehends key points    Patient understands monitoring blood glucose, interpreting and using results Needs Review    Patient understands prevention, detection, and treatment of acute complications. Comprehends key points      Outcomes   Expected Outcomes Demonstrated interest in learning. Expect positive outcomes    Future DMSE 4-6 wks    Program Status Not Completed             Individualized Plan for Diabetes Self-Management Training:   Learning Objective:  Patient will have a greater understanding of diabetes self-management. Patient education plan is to attend individual and/or group sessions per assessed needs and concerns.   Plan:   Patient Instructions  Thank you for your visit today.   I will ask for a Freestyle Libre 3 reader and sensors to be sent to your pharmacy on [address].  See if they will let you have it they may ask you to bring the [facility] 2  back.  Bring either one with you to our next visit n I can show you how to use it.  Bring any questions with you as well. If you treat yourself, plan on taking a walk afterwards.  Congrats on your blood sugar today.  [name] [phone] or [phone]   Expected Outcomes:  Demonstrated interest in learning. Expect positive outcomes  Education material provided: Diabetes Resources  If problems or questions, patient to contact team via:  Phone  Future DSME appointment: 4-6 wks Norm Parcel, RD 04/18/2023 11:35 AM.

## 2023-04-18 NOTE — Patient Instructions (Addendum)
Thank you for your visit today.   I will ask for a Freestyle Libre 3 reader and sensors to be sent to your pharmacy on [address].  See if they will let you have it they may ask you to bring the [facility] 2 back.  Bring either one with you to our next visit n I can show you how to use it.  Bring any questions with you as well. If you treat yourself, plan on taking a walk afterwards.  Congrats on your blood sugar today.  [name] [phone] or [phone]

## 2023-04-24 ENCOUNTER — Telehealth: Payer: Self-pay

## 2023-04-24 NOTE — Telephone Encounter (Addendum)
PA for pt ( [id number] ) came through on cover my meds ...... But on pt med list   the pharmacy note states  IM program OK  to change to lantus  .... But the Pa came  from [facility] .. I have tried to call the pt to see if they  have insurance  or do they use the IM program it states that the vmail is not set up

## 2023-04-28 ENCOUNTER — Other Ambulatory Visit (HOSPITAL_COMMUNITY): Payer: Self-pay

## 2023-04-28 NOTE — Telephone Encounter (Signed)
No PA needed.   Lantus filled at pharmacy 04/25/23 on patients managed medicaid insurance.   Medication no longer on IM program, as well as IM program is for uninsured patients at [facility] only.

## 2023-05-23 LAB — HM DIABETES EYE EXAM

## 2023-05-24 ENCOUNTER — Encounter: Payer: Self-pay | Admitting: Dietician

## 2023-06-02 ENCOUNTER — Encounter: Payer: Medicaid Other | Admitting: Student

## 2023-07-03 ENCOUNTER — Other Ambulatory Visit: Payer: Self-pay | Admitting: Student

## 2023-07-24 ENCOUNTER — Encounter: Payer: Medicaid Other | Admitting: Student

## 2023-07-28 ENCOUNTER — Other Ambulatory Visit: Payer: Self-pay | Admitting: Student

## 2023-07-28 ENCOUNTER — Encounter: Payer: Self-pay | Admitting: *Deleted

## 2023-07-28 NOTE — Telephone Encounter (Signed)
 I called pt to re-schedule his lat "no show" appt - no answer and no vm.

## 2023-08-24 ENCOUNTER — Other Ambulatory Visit: Payer: Self-pay | Admitting: Student

## 2023-08-24 NOTE — Telephone Encounter (Signed)
 Medication sent to pharmacy

## 2023-09-22 ENCOUNTER — Telehealth: Payer: Self-pay

## 2023-09-22 NOTE — Telephone Encounter (Signed)
 Prior Authorization for patient (FreeStyle Libre 2 Sensor) came through on cover my meds was submitted with last office notes and labs awaiting approval or denial.   KEY:[id number]

## 2023-09-22 NOTE — Telephone Encounter (Signed)
 Meredeth Ide (Key: [id number]) PA Case ID #: [id number] Need Help? Call us at [phone] Outcome Approved today by [facility] Medicaid 2017 Approved. This drug has been approved. Approved quantity: 3 units per 30 day(s). The drug has been approved from 09/08/2023 to 03/20/2024. Please call the pharmacy to process your prescription claim. Generic or biosimilar substitution may be required when available and preferred on the formulary. Effective Date: 09/08/2023 Authorization Expiration Date: 03/20/2024 Drug FreeStyle Libre 2 Sensor ePA cloud logo Form [facility] Medicaid of [facility] Electronic Prior Authorization Request Form [phone] NCPDP)

## 2023-12-16 ENCOUNTER — Other Ambulatory Visit: Payer: Self-pay | Admitting: Student

## 2023-12-18 NOTE — Telephone Encounter (Signed)
 Patient last seen 04/06/23, I called the patient unable to reach him and unable to lvm. Vm is not set up.

## 2024-01-20 ENCOUNTER — Other Ambulatory Visit: Payer: Self-pay | Admitting: Student

## 2024-02-18 ENCOUNTER — Other Ambulatory Visit: Payer: Self-pay | Admitting: Student

## 2024-03-12 ENCOUNTER — Telehealth: Payer: Self-pay

## 2024-03-12 NOTE — Telephone Encounter (Signed)
 Joshua Petty (Key: [facility]) Need Help? Call us  at [phone] Outcome Additional Information Required Prior Authorization Not Required Drug FreeStyle Libre 2 Sensor ePA cloud logo Form [facility] Medicaid of [place]  Electronic Prior Authorization Request Form [phone] NCPDP)

## 2024-03-12 NOTE — Telephone Encounter (Signed)
 Prior Authorization for patient (FreeStyle Libre 2 Sensor) came through on cover my meds was submitted awaiting approval or denial.  XZB:[id number]

## 2024-03-20 ENCOUNTER — Other Ambulatory Visit: Payer: Self-pay | Admitting: Student

## 2024-03-20 NOTE — Telephone Encounter (Signed)
 Medication sent to pharmacy

## 2024-03-26 LAB — HM DIABETES EYE EXAM

## 2024-04-14 ENCOUNTER — Other Ambulatory Visit: Payer: Self-pay | Admitting: Student

## 2024-04-15 NOTE — Telephone Encounter (Signed)
 Medication sent to pharmacy

## 2024-05-06 ENCOUNTER — Ambulatory Visit: Admitting: Student

## 2024-05-06 ENCOUNTER — Other Ambulatory Visit: Payer: Self-pay

## 2024-05-06 ENCOUNTER — Encounter: Payer: Self-pay | Admitting: Student

## 2024-05-06 ENCOUNTER — Telehealth: Payer: Self-pay

## 2024-05-06 VITALS — BP 139/86 | HR 89 | Temp 98.2°F | Ht 67.0 in | Wt 253.8 lb

## 2024-05-06 DIAGNOSIS — Z7985 Long-term (current) use of injectable non-insulin antidiabetic drugs: Secondary | ICD-10-CM

## 2024-05-06 DIAGNOSIS — Z1211 Encounter for screening for malignant neoplasm of colon: Secondary | ICD-10-CM

## 2024-05-06 DIAGNOSIS — I10 Essential (primary) hypertension: Secondary | ICD-10-CM | POA: Diagnosis not present

## 2024-05-06 DIAGNOSIS — Z8249 Family history of ischemic heart disease and other diseases of the circulatory system: Secondary | ICD-10-CM | POA: Diagnosis not present

## 2024-05-06 DIAGNOSIS — Z79899 Other long term (current) drug therapy: Secondary | ICD-10-CM

## 2024-05-06 DIAGNOSIS — E118 Type 2 diabetes mellitus with unspecified complications: Secondary | ICD-10-CM

## 2024-05-06 DIAGNOSIS — Z833 Family history of diabetes mellitus: Secondary | ICD-10-CM

## 2024-05-06 DIAGNOSIS — Z91148 Patient's other noncompliance with medication regimen for other reason: Secondary | ICD-10-CM | POA: Diagnosis not present

## 2024-05-06 DIAGNOSIS — Z794 Long term (current) use of insulin: Secondary | ICD-10-CM

## 2024-05-06 DIAGNOSIS — E1100 Type 2 diabetes mellitus with hyperosmolarity without nonketotic hyperglycemic-hyperosmolar coma (NKHHC): Secondary | ICD-10-CM | POA: Diagnosis present

## 2024-05-06 LAB — POCT GLYCOSYLATED HEMOGLOBIN (HGB A1C): HbA1c, POC (controlled diabetic range): 10.1 % — AB (ref 0.0–7.0)

## 2024-05-06 LAB — GLUCOSE, CAPILLARY: Glucose-Capillary: 281 mg/dL — ABNORMAL HIGH (ref 70–99)

## 2024-05-06 MED ORDER — LISINOPRIL-HYDROCHLOROTHIAZIDE 20-12.5 MG PO TABS: 2.0000 | ORAL_TABLET | Freq: Every day | ORAL | 3 refills | Status: AC

## 2024-05-06 MED ORDER — ROSUVASTATIN CALCIUM 10 MG PO TABS: 10.0000 mg | ORAL_TABLET | Freq: Every day | ORAL | 3 refills | Status: AC

## 2024-05-06 MED ORDER — FREESTYLE LIBRE 3 PLUS SENSOR MISC
3 refills | Status: AC
Start: 2024-05-06 — End: ?

## 2024-05-06 MED ORDER — FREESTYLE LIBRE 3 READER DEVI
0 refills | Status: AC
Start: 2024-05-06 — End: ?

## 2024-05-06 MED ORDER — METFORMIN HCL 500 MG PO TABS: 1000.0000 mg | ORAL_TABLET | Freq: Two times a day (BID) | ORAL | 11 refills | Status: AC

## 2024-05-06 MED ORDER — INSULIN GLARGINE 100 UNIT/ML SOLOSTAR PEN
30.0000 [IU] | PEN_INJECTOR | Freq: Every day | SUBCUTANEOUS | 11 refills | Status: AC
Start: 2024-05-06 — End: ?

## 2024-05-06 MED ORDER — INSULIN PEN NEEDLE 32G X 4 MM MISC
3 refills | Status: AC
Start: 2024-05-06 — End: ?

## 2024-05-06 NOTE — Patient Instructions (Addendum)
 Thank you, [name] for allowing us  to provide your care today. Today we discussed:  -A1c 10.1% today. We talked about the dangers of diabetes and how it can affect other parts of your body (eyes, kidneys and nerves). Can increase risk of heart attacks and strokes.   -TAKE Lantus  insulin  30 units once a day  -TAKE Metformin  500 mg once a day for 7 days then increase to twice a day for 7 days, add additional tablet until you get to 2 tablets twice a day if no side effects (max is 1000 mg twice a day)  -Make sure to check your blood sugar EVERY MORNING either with meter or Freestyle Libre -Call the office if you have low blood sugar <70  -Referral back to see Arland again  -Blood work today, I will call with results.    I have ordered the following medication/changed the following medications:  Start the following medications: Meds ordered this encounter  Medications   lisinopril -hydrochlorothiazide  (ZESTORETIC ) 20-12.5 MG tablet    Sig: Take 2 tablets by mouth daily.    Dispense:  180 tablet    Refill:  3   rosuvastatin  (CRESTOR ) 10 MG tablet    Sig: Take 1 tablet (10 mg total) by mouth daily.    Dispense:  90 tablet    Refill:  3    IM program   Continuous Glucose Receiver (FREESTYLE LIBRE 3 READER) DEVI    Sig: Use to check blood sugar continuously.    Dispense:  1 each    Refill:  0   Continuous Glucose Sensor (FREESTYLE LIBRE 3 PLUS SENSOR) MISC    Sig: Change sensor every 15 days.    Dispense:  6 each    Refill:  3   insulin  glargine (LANTUS ) 100 UNIT/ML Solostar Pen    Sig: Inject 30 Units into the skin daily.    Dispense:  9 mL    Refill:  11    IM Program ok to change to lantus  solostar per secure chat with emily bender 02/28/23 ALB   Insulin  Pen Needle 32G X 4 MM MISC    Sig: Use new needle for insulin  injection at least once a day.    Dispense:  100 each    Refill:  3    IM Program   metFORMIN  (GLUCOPHAGE ) 500 MG tablet    Sig: Take 2 tablets (1,000 mg  total) by mouth 2 (two) times daily with a meal.    Dispense:  120 tablet    Refill:  11     Follow up: 2-4 weeks   Should you have any questions or concerns please call the internal medicine clinic at [phone].    Joshua Petty, D.O. [facility]

## 2024-05-06 NOTE — Telephone Encounter (Signed)
 Prior Authorization for patient (FreeStyle Libre 3 Plus Sensor) came through on cover my meds was submitted with last office notes and labs awaiting approval or denial.  KEY:[id number]

## 2024-05-06 NOTE — Assessment & Plan Note (Addendum)
 Status: Uncontrolled. Last A1c of >14 in 01/2023.  A1c today is 10.1.  Prescribed Lantus  30 units and metformin  1 g BID. Not taking metformin  consistently nor the Lantus . Meter: only twice a week. Patient denies hypoglycemia. Did not bring meter and not using CGM. Completed foot exam. No SGLT2 w/ hx DKA and groin infection. Discussed GLP-1, hesitant, will re-evaluate at next OV.   No changes to regimen but encouraged adherence. Discussed complications of uncontrolled T2DM, patient understands/aware.   Plan -Encourage adherence to Lantus  30 units and metformin  (titrate back up to avoid side effects) -Discuss again GLP-1 options -Refilled Libre 3 plus w/ device, continue meter monitoring daily   -A1c in 3 months -Referral back to see Arland -Ophthalmology exam: pending  -Urine ACR, elevated/improved 03/2023 on ACEI, repeat today -LDL 78 on 2021, prescribed rosuvastatin  10 mg, refills sent, repeat lipid panel today

## 2024-05-06 NOTE — Assessment & Plan Note (Addendum)
 BP: 139/86.  Prescribed lisinopril -hydrochlorothiazide  40-25 mg.  Reports adherence and dispense records to support this. BMP 02/2023 with GFR 63 but noted history of CKD 3A.  Plan -Continue lisinopril -HCTZ, refill sent -BMP today

## 2024-05-06 NOTE — Progress Notes (Signed)
 CC: T2DM  HPI: [name] is a 58 y.o. male living with a history stated below and presents today for T2DM. Please see problem based assessment and plan for additional details.  Past Medical History:  Diagnosis Date   Diabetes (HCC)    Gonorrhea    Hyperlipidemia    Hypertension     Current Outpatient Medications on File Prior to Visit  Medication Sig Dispense Refill   Blood Glucose Monitoring Suppl (TRUE METRIX METER) w/Device KIT If you are injecting insulin  you MUST check your blood sugar. If <100 do not inject insulin  1 kit 0   glucose blood (TRUE METRIX BLOOD GLUCOSE TEST) test strip If you are injecting insulin  you MUST check your blood sugar. If <100 do not inject insulin  100 each 2   glucose blood test strip Use as instructed 100 each 12   lidocaine  (LIDODERM ) 5 % Place 1 patch onto the skin daily. Remove & Discard patch within 12 hours or as directed by MD 30 patch 0   TRUEplus Lancets 30G MISC If you are injecting insulin  you MUST check your blood sugar. If <100 do not inject insulin  100 each 2   No current facility-administered medications on file prior to visit.    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  Tobacco Use   Smoking status: Never   Smokeless tobacco: Never  Vaping Use   Vaping status: Never Used  Substance and Sexual Activity   Alcohol use: No   Drug use: Yes    Types: Marijuana    Comment: 4 joints/day   Sexual activity: Yes    Partners: Female    Birth control/protection: None  Other Topics Concern   Not on file  Social History Narrative   Significant other at bedside    Social Drivers of Health   Financial Resource Strain: Low Risk  (05/06/2024)   Overall Financial Resource Strain (CARDIA)    Difficulty of Paying Living Expenses: Not very hard  Food Insecurity: No Food Insecurity (05/06/2024)   Hunger Vital Sign    Worried About Running Out of Food in the Last Year: Never true    Ran Out of Food in the Last Year: Never true  Transportation Needs: No Transportation Needs (05/06/2024)   PRAPARE - Administrator, Civil Service (Medical): No    Lack of Transportation (Non-Medical): No  Physical Activity: Sufficiently Active (05/06/2024)   Exercise Vital Sign    Days of Exercise per Week: 3 days    Minutes of Exercise per Session: 60 min  Stress: No Stress Concern Present (05/06/2024)   [employer] of Occupational Health - Occupational Stress Questionnaire    Feeling of Stress: Not at all  Social Connections: Moderately Isolated (05/06/2024)   Social Connection and Isolation Panel    Frequency of Communication with Friends and Family: More than three times a week    Frequency of Social Gatherings with Friends and Family: Once a week    Attends Religious Services: Never    Database administrator or  Organizations: Yes    Attends Banker Meetings: Never    Marital Status: Divorced  Catering manager Violence: Not At Risk (05/06/2024)   Humiliation, Afraid, Rape, and Kick questionnaire    Fear of Current or Ex-Partner: No    Emotionally Abused: No    Physically Abused: No    Sexually Abused: No    Review of Systems: ROS negative except for what is noted on the assessment and plan.  Vitals:   05/06/24 1042 05/06/24 1050 05/06/24 1123  BP: (!) 175/121 (!) 135/97 139/86  Pulse: 89 99 89  Temp: 98.2 F (36.8 C)    TempSrc: Oral    SpO2: 97%    Weight: 253 lb 12.8 oz (115.1 kg)    Height: 5' 7 (1.702 m)     Physical Exam: Constitutional: well-appearing male sitting in chair, in no acute distress Cardiovascular:  regular rate and rhythm Pulmonary/Chest: normal work of breathing on room air, lungs clear to auscultation bilaterally Neurological: alert & oriented x 3 Foot: no open wounds/cuts on b/l feet, palpable DP pulse bilaterally   Assessment & Plan:   Assessment & Plan Uncontrolled type 2 diabetes mellitus with hyperosmolar nonketotic hyperglycemia (HCC) Status: Uncontrolled. Last A1c of >14 in 01/2023.  A1c today is 10.1.  Prescribed Lantus  30 units and metformin  1 g BID. Not taking metformin  consistently nor the Lantus . Meter: only twice a week. Patient denies hypoglycemia. Did not bring meter and not using CGM. Completed foot exam. No SGLT2 w/ hx DKA and groin infection. Discussed GLP-1, hesitant, will re-evaluate at next OV.   No changes to regimen but encouraged adherence. Discussed complications of uncontrolled T2DM, patient understands/aware.   Plan -Encourage adherence to Lantus  30 units and metformin  (titrate back up to avoid side effects) -Discuss again GLP-1 options -Refilled Libre 3 plus w/ device, continue meter monitoring daily   -A1c in 3 months -Referral back to see [facility] -Ophthalmology exam: pending  -Urine ACR, elevated/improved 03/2023 on ACEI, repeat today -LDL 78 on 2021, prescribed rosuvastatin  10 mg, refills sent, repeat lipid panel today Essential hypertension BP: 139/86.  Prescribed lisinopril -hydrochlorothiazide  40-25 mg.  Reports adherence and dispense records to support this. BMP 02/2023 with GFR 63 but noted history of CKD 3A.  Plan -Continue lisinopril -HCTZ, refill sent -BMP today Colon cancer screening Cologuard ordered.   Orders Placed This Encounter  Procedures   Microalbumin / Creatinine Urine Ratio   Basic metabolic panel with GFR   Lipid Profile   Glucose, capillary   Cologuard   Referral to Nutrition and Diabetes Services   POC Hbg A1C   Return in about 3 weeks (around 05/27/2024) for diabetes.   Patient discussed with Dr. Trudy Ozell Nearing, D.O. [facility] Internal Medicine, PGY-3 Phone: [phone] Date 05/06/2024 Time 5:30 PM

## 2024-05-07 ENCOUNTER — Ambulatory Visit: Payer: Self-pay | Admitting: Student

## 2024-05-07 LAB — BASIC METABOLIC PANEL WITH GFR
BUN/Creatinine Ratio: 17 (ref 9–20)
BUN: 22 mg/dL (ref 6–24)
CO2: 20 mmol/L (ref 20–29)
Calcium: 9.7 mg/dL (ref 8.7–10.2)
Chloride: 96 mmol/L (ref 96–106)
Creatinine, Ser: 1.33 mg/dL — ABNORMAL HIGH (ref 0.76–1.27)
Glucose: 258 mg/dL — ABNORMAL HIGH (ref 70–99)
Potassium: 4 mmol/L (ref 3.5–5.2)
Sodium: 132 mmol/L — ABNORMAL LOW (ref 134–144)
eGFR: 62 mL/min/1.73 (ref >59)

## 2024-05-07 LAB — LIPID PANEL
Chol/HDL Ratio: 5.1 ratio — ABNORMAL HIGH (ref 0.0–5.0)
Cholesterol, Total: 202 mg/dL — ABNORMAL HIGH (ref 100–199)
HDL: 40 mg/dL (ref >39)
LDL Chol Calc (NIH): 106 mg/dL — ABNORMAL HIGH (ref 0–99)
Triglycerides: 326 mg/dL — ABNORMAL HIGH (ref 0–149)
VLDL Cholesterol Cal: 56 mg/dL — ABNORMAL HIGH (ref 5–40)

## 2024-05-07 LAB — MICROALBUMIN / CREATININE URINE RATIO
Creatinine, Urine: 174.9 mg/dL
Microalb/Creat Ratio: 2302 mg/g{creat} — ABNORMAL HIGH (ref 0–29)
Microalbumin, Urine: 4025.7 ug/mL

## 2024-05-07 NOTE — Telephone Encounter (Signed)
 Joshua Petty (Key: [id number]) PA Case ID #: [id number] Rx #: [id number] Need Help? Call us  at [phone] Outcome Approved on October 20 by [facility] 2017 Approved. This drug has been approved. Approved quantity: 6 sensors per 90 day(s). You may fill up to a 34 day supply at a retail pharmacy. You may fill up to a 90 day supply for maintenance drugs, please refer to the formulary for details. Please call the pharmacy to process your prescription claim. Effective Date: 05/06/2024 Authorization Expiration Date: 05/06/2025 Drug FreeStyle Libre 3 Plus Sensor ePA cloud logo Form [facility] Medicaid of [place]  Electronic Prior Authorization Request Form [phone] NCPDP) Original Claim Info 48  I called the patient to let him know about the approval. I was unable to reach the patient, unable to lvm. Voicemail has not been set up.

## 2024-05-07 NOTE — Progress Notes (Signed)
 Internal Medicine Clinic Attending  Case discussed with the resident at the time of the visit.  We reviewed the resident's history and exam and pertinent patient test results.  I agree with the assessment, diagnosis, and plan of care documented in the resident's note.

## 2024-05-09 ENCOUNTER — Encounter: Payer: Self-pay | Admitting: Student

## 2024-05-09 DIAGNOSIS — R809 Proteinuria, unspecified: Secondary | ICD-10-CM | POA: Insufficient documentation

## 2024-05-13 ENCOUNTER — Ambulatory Visit: Admitting: Dietician

## 2024-05-27 ENCOUNTER — Ambulatory Visit: Admitting: Student

## 2024-05-27 NOTE — Progress Notes (Deleted)
 CC: ***  HPI: Joshua Petty is a 58 y.o. male living with a history stated below and presents today for ***. Please see problem based assessment and plan for additional details.  Past Medical History:  Diagnosis Date   Diabetes (HCC)    Gonorrhea    Hyperlipidemia    Hypertension     Current Outpatient Medications on File Prior to Visit  Medication Sig Dispense Refill   Blood Glucose Monitoring Suppl (TRUE METRIX METER) w/Device KIT If you are injecting insulin  you MUST check your blood sugar. If <100 do not inject insulin  1 kit 0   Continuous Glucose Receiver (FREESTYLE LIBRE 3 READER) DEVI Use to check blood sugar continuously. 1 each 0   Continuous Glucose Sensor (FREESTYLE LIBRE 3 PLUS SENSOR) MISC Change sensor every 15 days. 6 each 3   glucose blood (TRUE METRIX BLOOD GLUCOSE TEST) test strip If you are injecting insulin  you MUST check your blood sugar. If <100 do not inject insulin  100 each 2   glucose blood test strip Use as instructed 100 each 12   insulin  glargine (LANTUS ) 100 UNIT/ML Solostar Pen Inject 30 Units into the skin daily. 9 mL 11   Insulin  Pen Needle 32G X 4 MM MISC Use new needle for insulin  injection at least once a day. 100 each 3   lidocaine  (LIDODERM ) 5 % Place 1 patch onto the skin daily. Remove & Discard patch within 12 hours or as directed by MD 30 patch 0   lisinopril -hydrochlorothiazide  (ZESTORETIC ) 20-12.5 MG tablet Take 2 tablets by mouth daily. 180 tablet 3   metFORMIN  (GLUCOPHAGE ) 500 MG tablet Take 2 tablets (1,000 mg total) by mouth 2 (two) times daily with a meal. 120 tablet 11   rosuvastatin  (CRESTOR ) 10 MG tablet Take 1 tablet (10 mg total) by mouth daily. 90 tablet 3   TRUEplus Lancets 30G MISC If you are injecting insulin  you MUST check your blood sugar. If <100 do not inject insulin  100 each 2   No current facility-administered medications on file prior to visit.    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  Tobacco Use   Smoking status: Never   Smokeless tobacco: Never  Vaping Use   Vaping status: Never Used  Substance and Sexual Activity   Alcohol use: No   Drug use: Yes    Types: Marijuana    Comment: 4 joints/day   Sexual activity: Yes    Partners: Female    Birth control/protection: None  Other Topics Concern   Not on file  Social History Narrative   Significant other at bedside   Social Drivers of Health   Financial Resource Strain: Low Risk  (05/06/2024)   Overall Financial Resource Strain (CARDIA)    Difficulty of Paying Living Expenses: Not very hard  Food Insecurity: No Food Insecurity (05/06/2024)   Hunger Vital Sign    Worried About Running Out of Food in the Last Year: Never true    Ran Out of Food in the Last Year: Never true  Transportation Needs:  No Transportation Needs (05/06/2024)   PRAPARE - Administrator, Civil Service (Medical): No    Lack of Transportation (Non-Medical): No  Physical Activity: Sufficiently Active (05/06/2024)   Exercise Vital Sign    Days of Exercise per Week: 3 days    Minutes of Exercise per Session: 60 min  Stress: No Stress Concern Present (05/06/2024)   [employer] of Occupational Health - Occupational Stress Questionnaire    Feeling of Stress: Not at all  Social Connections: Moderately Isolated (05/06/2024)   Social Connection and Isolation Panel    Frequency of Communication with Friends and Family: More than three times a week    Frequency of Social Gatherings with Friends and Family: Once a week    Attends Religious Services: Never    Database Administrator or  Organizations: Yes    Attends Banker Meetings: Never    Marital Status: Divorced  Catering Manager Violence: Not At Risk (05/06/2024)   Humiliation, Afraid, Rape, and Kick questionnaire    Fear of Current or Ex-Partner: No    Emotionally Abused: No    Physically Abused: No    Sexually Abused: No    Review of Systems: ROS negative except for what is noted on the assessment and plan.  There were no vitals filed for this visit.  Physical Exam  Physical Exam: Constitutional: well-appearing *** sitting in ***, in no acute distress HENT: normocephalic atraumatic, mucous membranes moist Eyes: conjunctiva non-erythematous Cardiovascular: regular rate and rhythm, no m/r/g Pulmonary/Chest: normal work of breathing on room air, lungs clear to auscultation bilaterally Abdominal: soft, non-tender, non-distended MSK: *** Neurological: alert & oriented x 3, 5/5 strength in bilateral upper and lower extremities, normal gait Skin: warm and dry Psych: ***  Assessment & Plan:   Assessment & Plan     No orders of the defined types were placed in this encounter. PMH: HTN, GERD, T2DM, CKD 3A, obesity, HLD  T2DM Status: Uncontrolled. A1c 10.1 at LOV 10/20 from >14.Poor adherence to medications which was discussed at LOV. Prescribed Lantus  30 units and metformin  1 g BID.  Meter: ***. Patient *** hypoglycemia. No SGLT2 w/ hx DKA and groin infection. Discussed GLP-1 ***.     Plan -Encourage adherence to Lantus  30 units and metformin  (titrate back up to avoid side effects) -Discuss again GLP-1 options -Refilled Libre 3 plus w/ device, continue meter monitoring daily   -A1c in 3 months -Referral back to see [facility] -Ophthalmology exam: pending  -Urine ACR, elevated/improved 03/2023 on ACEI, repeat today -LDL 78 on 2021, prescribed rosuvastatin  10 mg   HTN BP: ***.  Prescribed lisinopril -hydrochlorothiazide  40-25 mg.  Reports adherence and dispense records to support this. BMP  04/2024 with GFR 62 but noted history of CKD 3A.   Plan -Continue lisinopril -HCTZ  ***Tdap?, Flu?  No follow-ups on file.   Patient {GC/GE:[id number]::discussed with,seen with} Dr. {[id number]::Tpoopjfd,Z. Hoffman,Winfrey,Narendra,Chun,Chambliss,Lau,Machen}  Ozell Nearing, D.O. [facility] Internal Medicine, PGY-3 Clinic Phone: [phone] Date 05/27/2024 Time 7:37 AM

## 2024-07-02 ENCOUNTER — Encounter: Payer: Self-pay | Admitting: Dietician
